# Patient Record
Sex: Female | Born: 2000 | Race: Black or African American | Hispanic: No | Smoking: Never smoker
Health system: Southern US, Community
[De-identification: ages and names within clinical notes are randomized; demographics above are authoritative.]

## PROBLEM LIST (undated history)

## (undated) DIAGNOSIS — J302 Other seasonal allergic rhinitis: Secondary | ICD-10-CM

## (undated) DIAGNOSIS — R51 Headache: Secondary | ICD-10-CM

## (undated) DIAGNOSIS — Z789 Other specified health status: Secondary | ICD-10-CM

## (undated) HISTORY — PX: NO PAST SURGERIES: SHX2092

## (undated) HISTORY — DX: Other specified health status: Z78.9

## (undated) HISTORY — DX: Headache: R51

---
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
## 2021-01-07 ENCOUNTER — Ambulatory Visit (INDEPENDENT_AMBULATORY_CARE_PROVIDER_SITE_OTHER): Payer: Medicaid Other

## 2021-01-07 ENCOUNTER — Other Ambulatory Visit: Payer: Self-pay

## 2021-01-07 VITALS — BP 112/68 | HR 72

## 2021-01-07 DIAGNOSIS — Z3042 Encounter for surveillance of injectable contraceptive: Secondary | ICD-10-CM

## 2021-01-07 MED ORDER — MEDROXYPROGESTERONE ACETATE 150 MG/ML IM SUSP
150.0000 mg | Freq: Once | INTRAMUSCULAR | Status: AC
  Administered 2021-01-07: 150 mg via INTRAMUSCULAR

## 2021-01-07 NOTE — Progress Notes (Signed)
Patient was assessed and managed by nursing staff during this encounter. I have reviewed the chart and agree with the documentation and plan.   Jaynie Collins, MD 01/07/2021 4:56 PM

## 2021-01-07 NOTE — Progress Notes (Signed)
Date last pap: NA Last Depo-Provera: 10/10/2020 Side Effects if any: None. Serum HCG indicated? NA. Depo-Provera 150 mg IM given by: Philemon Kingdom, CMA. Next appointment due: Aug 29th-Sept 12th

## 2021-01-30 ENCOUNTER — Ambulatory Visit: Payer: Medicaid Other | Admitting: Advanced Practice Midwife

## 2021-02-11 ENCOUNTER — Ambulatory Visit (INDEPENDENT_AMBULATORY_CARE_PROVIDER_SITE_OTHER): Payer: Medicaid Other | Admitting: Family

## 2021-02-11 ENCOUNTER — Other Ambulatory Visit: Payer: Self-pay

## 2021-02-11 VITALS — BP 116/74 | HR 68 | Wt 177.0 lb

## 2021-02-11 DIAGNOSIS — G43009 Migraine without aura, not intractable, without status migrainosus: Secondary | ICD-10-CM

## 2021-02-11 DIAGNOSIS — G44219 Episodic tension-type headache, not intractable: Secondary | ICD-10-CM | POA: Diagnosis not present

## 2021-02-11 DIAGNOSIS — G43701 Chronic migraine without aura, not intractable, with status migrainosus: Secondary | ICD-10-CM

## 2021-02-11 MED ORDER — NURTEC 75 MG PO TBDP
ORAL_TABLET | ORAL | 2 refills | Status: DC
Start: 2021-02-11 — End: 2021-02-28

## 2021-02-11 NOTE — Patient Instructions (Signed)
Thank you for coming in today.   Instructions for you until your next appointment are as follows: Continue the Emgality injections. Remember that you should not become pregnant while taking Emgality because of risk to the unborn baby. We will taper you off the Verapamil. To do this take it as follows: Take 1 tablet in the morning and 2 tablets at night for 1 week,  Then take 1 tablet in the morning and 1 tablet at night for 1 week Take none in the morning and 1 at night for 1 week,  Then stop the medication I have sent in a prescription for Nurtec ODT. This is to replace the Imitrex when you have a migraine. Take it as follows: Take 1 tablet at onset of migraine. May repeat it in 24 hours if needed. No more than 8 tablets per month.  Do not take Imitrex and Nurtec at the same time.  Remember that you should not become pregnant while taking Nurtec-ODT because of risk to the unborn baby. Check with [facility] - ask for [employer]. I will write a letter or complete a form if needed.  Please sign up for MyChart if you have not done so. Please plan to return for follow up in 3 months or sooner if needed.  At [facility], we are committed to providing exceptional care. You will receive a patient satisfaction survey through text or email regarding your visit today. Your opinion is important to me. Comments are appreciated.

## 2021-02-11 NOTE — Progress Notes (Signed)
Lindsay [name]   MRN:  [id number]  2001-04-27   Provider: Rockwell Germany NP-C Location of Care: [facility]  Visit type: Follow up  Last visit: 09/24/2020 Referral source: Roselind Messier, MD History from: Lindsay [name], Patient, mom  Brief history:  Copied from previous record: History of migraine and tension headaches, poorly controlled with preventative medication. She is taking Verapamil for migraine prevention and continues to experience about 15 migraine headaches per month. Emgality was started and tolerated well but she stopped it when she began pregnant. She has Sumatriptan and Promethazine for relief, as well as taking Ibuprofen. She has Odansetron that she uses at school. With her migraines she has holocephalic pain, intolerance to light and sound, dizziness and vomiting. She must sleep to obtain any relief.   Hollan delivered a healthy baby boy on September 10, 2020  Today's concerns: Lindsay [name] reports today that she continues to experience frequent migraine headaches. She does not feel that Verapamil has been very helpful to her. She has restarted Emgality now that her infant has been delivered and that she has stopped breast feeding. Lindsay [name] also does not feel that Sumatriptan has been particularly helpful and asks if there is another abortive medication to try.   Lindsay [name] will be attending [employer] this fall and has questions about what to do if a migraine occurs at school or if she has to miss class due to migraine.   Lindsay [name] has been otherwise generally healthy since she was last seen. Neither she nor her mother have other health concerns for her today other than previously mentioned.  Review of systems: Please see HPI for neurologic and other pertinent review of systems. Otherwise all other systems were reviewed and were negative.  Problem List: Patient Active Problem List   Diagnosis Date Noted   Chronic migraine without aura with status migrainosus,  not intractable 09/28/2020   Supervision of normal first pregnancy 09/09/2020   Supervision of normal first teen pregnancy 02/16/2020   Maternal exposure to teratogen, first trimester 02/16/2020   Keloid of skin 04/19/2019   Episodic tension type headache 11/01/2014   Intermittent asthma 11/21/2013   Attention deficit hyperactivity disorder (ADHD) 07/05/2013   Migraine without aura 05/09/2013     Past Medical History:  Diagnosis Date   Attention deficit disorder with hyperactivity(314.01) 07/05/2013   Episodic tension type headache 11/01/2014   Intermittent asthma 11/21/2013   Triggered by seasonal allergies & occasional exercise induced.    Keloid of skin 04/19/2019   Medical history non-contributory    Migraine without aura 05/09/2013   Seasonal allergies     Past medical history comments: See HPI Copied from previous record: Onset of migraines at 20 years of age. These were frequent and caused her to miss school. Headaches were prolonged lasting the better part of the day. She has been treated and failed Periactin (2 mg TID), and topiramate (60 mg BID). Amitriptyline also failed dose is unknown.  She has asthma which contraindicates propranolol.   Surgical history: Past Surgical History:  Procedure Laterality Date   NO PAST SURGERIES      Family history: family history includes Anxiety disorder in her mother; Constipation in her mother; Depression in her mother; GER disease in her mother; Irritable bowel syndrome in her mother; Migraines in her mother.   Social history: Social History   Socioeconomic History   Marital status: Single    Spouse name: Not on file   Number of children: Not on file   Years  of education: Not on file   Highest education level: Not on file  Occupational History   Not on file  Tobacco Use   Smoking status: Never   Smokeless tobacco: Never  Vaping Use   Vaping Use: Never used  Substance and Sexual Activity   Alcohol use: No    Alcohol/week: 0.0  standard drinks   Drug use: No   Sexual activity: Not Currently  Other Topics Concern   Not on file  Social History Narrative   Shakora graduated early from [employer].    She is waiting to be cleared to go back to work.    She lives with her mother.   She enjoys cooking, drawing, and painting.   Social Determinants of Health   Financial Resource Strain: Not on file  Food Insecurity: Not on file  Transportation Needs: Not on file  Physical Activity: Not on file  Stress: Not on file  Social Connections: Not on file  Intimate Partner Violence: Not on file    Past/failed meds: Copied from previous record: Cyproheptadine, Topiramate, Amitriptyline Divalproex stopped due to pregnancy 01/2020  Allergies: No Known Allergies   Immunizations: Immunization History  Administered Date(s) Administered   DTaP 05/06/2002, 08/17/2002, 10/06/2002, 05/23/2003, 03/09/2006   H1N1 07/05/2008   HPV 9-valent 06/12/2014   HPV Quadrivalent 11/21/2013, 02/01/2014   Hepatitis A 03/09/2006, 11/20/2006   Hepatitis B 07/24/02, 05/06/2002, 10/06/2002   HiB (PRP-OMP) 05/06/2002, 08/17/2002, 10/06/2002, 05/23/2003   IPV 05/06/2002, 08/17/2002, 10/06/2002, 03/09/2006   Influenza Nasal 07/02/2011, 04/12/2014   Influenza,inj,Quad PF,6+ Mos 09/13/2015, 04/25/2016, 04/29/2017   Influenza-Unspecified 05/29/2006, 07/09/2007, 07/05/2008, 05/22/2010, 09/23/2012, 05/03/2013   MMR 05/23/2003, 03/09/2006   Meningococcal Conjugate 11/21/2013   Pneumococcal-Unspecified 08/17/2002, 10/06/2002, 02/23/2004   Tdap 11/21/2013, 10/10/2020   Varicella 02/23/2004, 03/09/2006    Diagnostics/Screenings: Copied from previous record: CT Head 03/05/10 - normal   Physical Exam: BP 116/74   Pulse 68   Wt 177 lb (80.3 kg)   BMI 29.45 kg/m   General: Well developed, well nourished adolescent girl, seated on exam table, in no evident distress, black hair, brown eyes, right handed Head: Head normocephalic and atraumatic.   Oropharynx benign. Neck: Supple Cardiovascular: Regular rate and rhythm, no murmurs Respiratory: Breath sounds clear to auscultation Musculoskeletal: No obvious deformities or scoliosis Skin: No rashes or neurocutaneous lesions  Neurologic Exam Mental Status: Awake and fully alert.  Oriented to place and time.  Recent and remote memory intact.  Attention span, concentration, and fund of knowledge appropriate.  Mood and affect appropriate. Cranial Nerves: Fundoscopic exam reveals sharp disc margins.  Pupils equal, briskly reactive to light.  Extraocular movements full without nystagmus.  Visual fields full to confrontation.  Hearing intact and symmetric to finger rub.  Facial sensation intact.  Face tongue, palate move normally and symmetrically.  Neck flexion and extension normal. Motor: Normal bulk and tone. Normal strength in all tested extremity muscles. Sensory: Intact to touch and temperature in all extremities.  Coordination: Rapid alternating movements normal in all extremities.  Finger-to-nose and heel-to shin performed accurately bilaterally.  Romberg negative. Gait and Station: Arises from chair without difficulty.  Stance is normal. Gait demonstrates normal stride length and balance.   Able to heel, toe and tandem walk without difficulty. Reflexes: 1+ and symmetric. Toes downgoing.   Impression: Migraine without aura and without status migrainosus, not intractable - Plan: NURTEC 75 MG TBDP  Chronic migraine without aura with status migrainosus, not intractable  Episodic tension-type headache, not intractable   Recommendations  for plan of care: The patient's previous [facility] records were reviewed. Kylena  has neither had nor required imaging or lab studies since the last visit, other than what was been performed by her OB/GYN. She is an 20 year old girl with history of frequent severe migraine and tension headaches. She has not experienced good success with any preventative  medication. Kristine Linea wants to taper off Verapamil and gave her written instructions on doing so. She has restarted Emgality injections and explained to her that is acceptable as long as she is using reliable birth control, as the effect of this medication is not known in pregnancy.   Cloee does not feel that Sumatriptan is helpful in giving her relief. I recommended a trial of Nurtec-ODT and gave her written instructions on how to take it when a migraine occurs.   Meliza will be attending [employer] this fall and I recommended that she talk with the office of disability services for that school regarding a plan for migraine days. I will be happy to complete a form or write a letter for her if needed.   I will otherwise see Chamari back in follow up in 3 months or sooner if needed. She agreed with the plans made today.   The medication list was reviewed and reconciled. I reviewed changes that were made in the prescribed medications today. A complete medication list was provided to the patient.  Return in about 3 months (around 05/14/2021).   Allergies as of 02/11/2021   No Known Allergies      Medication List        Accurate as of February 11, 2021 11:59 PM. If you have any questions, ask your nurse or doctor.          STOP taking these medications    SUMAtriptan 100 MG tablet Commonly known as: IMITREX Stopped by: Rockwell Germany, NP       TAKE these medications    acetaminophen 500 MG tablet Commonly known as: TYLENOL Take 1,000 mg by mouth every 6 (six) hours as needed for mild pain or headache.   Emgality 120 MG/ML Soaj Generic drug: Galcanezumab-gnlm Inject 120 mg into the skin every 30 (thirty) days.   medroxyPROGESTERone 150 MG/ML injection Commonly known as: DEPO-PROVERA Inject 1 mL (150 mg total) into the muscle every 3 (three) months.   norgestimate-ethinyl estradiol 0.25-35 MG-MCG tablet Commonly known as: ORTHO-CYCLEN Take 1 tablet by mouth daily.   Nurtec  75 MG Tbdp Generic drug: Rimegepant Sulfate Take 1 tablet at onset of migraine. May take another tablet in 48 hours if needed. No more than 8 tablets in 30 days. Started by: Rockwell Germany, NP   PrePLUS 27-1 MG Tabs Take 1 tablet by mouth daily.   verapamil 40 MG tablet Commonly known as: CALAN TAKE 2 TABLETS BY MOUTH IN THE MORNING AND IN THE EVENING        Total time spent with the patient was 30 minutes, of which 50% or more was spent in counseling and coordination of care.  Rockwell Germany NP-C [facility] Ph. [phone] Fax [phone]

## 2021-02-12 ENCOUNTER — Other Ambulatory Visit (INDEPENDENT_AMBULATORY_CARE_PROVIDER_SITE_OTHER): Payer: Self-pay | Admitting: Family

## 2021-02-12 MED ORDER — EMGALITY 120 MG/ML ~~LOC~~ SOAJ: 120.0000 mg | SUBCUTANEOUS | 5 refills | Status: DC

## 2021-02-12 NOTE — Telephone Encounter (Signed)
Please send to the pharmacy °

## 2021-02-12 NOTE — Telephone Encounter (Signed)
Who's calling (name and relationship to patient) : Elease Hashimoto Darcey mom   Best contact number: [phone]  Provider they see: Elveria Rising  Reason for call:   Call ID:      PRESCRIPTION REFILL ONLY  Name of prescription: Emgality  Pharmacy: [facility] [place] [address]

## 2021-02-12 NOTE — Telephone Encounter (Signed)
Done. TG 

## 2021-02-17 ENCOUNTER — Encounter (INDEPENDENT_AMBULATORY_CARE_PROVIDER_SITE_OTHER): Payer: Self-pay | Admitting: Family

## 2021-02-20 ENCOUNTER — Telehealth (INDEPENDENT_AMBULATORY_CARE_PROVIDER_SITE_OTHER): Payer: Self-pay | Admitting: Family

## 2021-02-20 NOTE — Telephone Encounter (Signed)
  Who's calling (name and relationship to patient) :[place] (Mother)  Best contact number: [phone] (Home) Provider they see: Elveria Rising, NP Reason for call: Please send PA to pharmacy.The pharmacy is stating that is what is need in order to fill rx.     PRESCRIPTION REFILL ONLY  Name of prescription:  Pharmacy:

## 2021-02-21 NOTE — Telephone Encounter (Signed)
Lvm for mom letting her know the PA was approved and sent to the pharmacy

## 2021-02-26 ENCOUNTER — Telehealth (INDEPENDENT_AMBULATORY_CARE_PROVIDER_SITE_OTHER): Payer: Self-pay | Admitting: Family

## 2021-02-26 DIAGNOSIS — G43009 Migraine without aura, not intractable, without status migrainosus: Secondary | ICD-10-CM

## 2021-02-26 NOTE — Telephone Encounter (Signed)
I called and spoke to Mom. I explained that the PA had been denied and that I had sent in an appeal. I received a fax today requesting a written consent from [name] in order to process the appeal. Mom agreed to bring [name] to the office tomorrow to sign a consent. TG

## 2021-02-26 NOTE — Telephone Encounter (Signed)
  Who's calling (name and relationship to patient) :[place] (Mother)  Best contact number: [phone] (Home) Provider they see: Elveria Rising, NP Reason for call: Mom is stating that even after we have sent over PA for prescription that the pharmacy is still stating they do not have it. Please contact pharmacy to insure they have received PA and contact mom when completed. Mom is overwhelmed with the process and how long this is taking she is aware that it is not on behalf of the office however she is still wanting to express her disappointment in the communication between the pharmacy and ourselves.     PRESCRIPTION REFILL ONLY  Name of prescription:  Pharmacy:

## 2021-02-28 MED ORDER — UBRELVY 50 MG PO TABS
ORAL_TABLET | ORAL | 5 refills | Status: DC
Start: 2021-02-28 — End: 2021-08-01

## 2021-02-28 NOTE — Addendum Note (Signed)
Addended by: Princella Ion on: 02/28/2021 08:20 AM   Modules accepted: Orders

## 2021-02-28 NOTE — Telephone Encounter (Signed)
I learned from [name] that Lindsay [name] is preferred. I sent in a new prescription for Ubrelvy. I attempted to call her and let her know but did not receive an answer. I will try again later today. TG

## 2021-02-28 NOTE — Telephone Encounter (Signed)
I called Mom and told her that I had learned that [place] insurance will cover Bernita Raisin and that I sent in a prescription for that. I asked Mom to let me know how that worked for the migraines. TG

## 2021-03-21 ENCOUNTER — Telehealth: Payer: Self-pay | Admitting: Radiology

## 2021-03-21 NOTE — Telephone Encounter (Signed)
Left message to call [facility] to remove depo injection to either 03/25/21 to a morning appointment or to move to 03/27/21 in the morning.

## 2021-03-25 ENCOUNTER — Ambulatory Visit: Payer: Medicaid Other

## 2021-03-25 ENCOUNTER — Telehealth (INDEPENDENT_AMBULATORY_CARE_PROVIDER_SITE_OTHER): Payer: Self-pay | Admitting: Family

## 2021-03-25 NOTE — Telephone Encounter (Signed)
Mother called back stating Inetta Fermo was going to write a care plan for patient to have at school regarding her headaches. She can be reached at [phone]. There is a DPR on file to speak with mother. I asked mother if a document was provided to [place] to fil out. She stated no document was needed, that Inetta Fermo was going to write something.  Barrington Ellison

## 2021-03-25 NOTE — Telephone Encounter (Signed)
  Who's calling (name and relationship to patient) : Reeda, [place] (Mother) Best contact number: [phone] (Home) Provider they see: Elveria Rising, NP Reason for call: Please have tina call mom to discuss documents that are needed to for [place] headaches that should be given to the college.     PRESCRIPTION REFILL ONLY  Name of prescription:  Pharmacy:

## 2021-03-25 NOTE — Telephone Encounter (Signed)
I called and spoke with Mom. She said that Lindsay [name] has an appointment with student services tomorrow to make a plan for her migraine headaches. Mom said that she needs a letter to present to the school for the meeting. I told Mom that I will write a letter and that she can pick it up tomorrow. TG

## 2021-03-28 ENCOUNTER — Other Ambulatory Visit: Payer: Self-pay

## 2021-03-28 ENCOUNTER — Ambulatory Visit (INDEPENDENT_AMBULATORY_CARE_PROVIDER_SITE_OTHER): Payer: Medicaid Other | Admitting: *Deleted

## 2021-03-28 DIAGNOSIS — Z3042 Encounter for surveillance of injectable contraceptive: Secondary | ICD-10-CM | POA: Diagnosis not present

## 2021-03-28 MED ORDER — MEDROXYPROGESTERONE ACETATE 150 MG/ML IM SUSP
150.0000 mg | Freq: Once | INTRAMUSCULAR | Status: AC
  Administered 2021-03-28: 150 mg via INTRAMUSCULAR

## 2021-03-28 NOTE — Progress Notes (Addendum)
Subjective:  Pt in for Depo Provera injection.    Objective: Need for contraception. No unusual complaints.    Assessment: Depo given R upper outer quadrant. Pt tolerated Depo injection.   Plan:  Next injection due Nov 17-Dec 1.    Administrations This Visit     medroxyPROGESTERone (DEPO-PROVERA) injection 150 mg     Admin Date 03/28/2021 Action Given Dose 150 mg Route Intramuscular Administered By Lewayne Bunting, CMA

## 2021-04-02 NOTE — Progress Notes (Signed)
Patient was assessed and managed by nursing staff during this encounter. I have reviewed the chart and agree with the documentation and plan. I have also made any necessary editorial changes.  Tasha Diaz, MD 04/02/2021 9:51 AM   

## 2021-04-17 ENCOUNTER — Telehealth (INDEPENDENT_AMBULATORY_CARE_PROVIDER_SITE_OTHER): Payer: Self-pay | Admitting: Family

## 2021-04-17 NOTE — Telephone Encounter (Signed)
  Who's calling (name and relationship to patient) : Elease Hashimoto - mom  Best contact number: [phone]  Provider they see: Elveria Rising  Reason for call: Mom requests call back from [place] regarding patient's headaches.    PRESCRIPTION REFILL ONLY  Name of prescription:  Pharmacy:

## 2021-04-19 NOTE — Telephone Encounter (Signed)
Spoke with mom to get further details and she informs she needed to speak with Inetta Fermo, let mom know that Inetta Fermo is out of the office and I am her medical assistant so I could take a message to pass it along to her. Mom declined to leave any details and asked that Inetta Fermo contact her upon her return to the office.

## 2021-04-22 NOTE — Telephone Encounter (Signed)
I called and spoke with Mom. She is frustrated that [name] continues to have so many migraines. She said that she is averaging 15-16 per month. I told Mom that options are limited but that I will refer Idamae to be considered for Botox if she is interested. Mom will talk with [name]. TG

## 2021-05-20 ENCOUNTER — Ambulatory Visit (INDEPENDENT_AMBULATORY_CARE_PROVIDER_SITE_OTHER): Payer: Medicaid Other | Admitting: Family

## 2021-05-27 ENCOUNTER — Telehealth (INDEPENDENT_AMBULATORY_CARE_PROVIDER_SITE_OTHER): Payer: Self-pay | Admitting: Family

## 2021-05-27 DIAGNOSIS — G43009 Migraine without aura, not intractable, without status migrainosus: Secondary | ICD-10-CM

## 2021-05-27 MED ORDER — EMGALITY 120 MG/ML ~~LOC~~ SOAJ: 120.0000 mg | SUBCUTANEOUS | 5 refills | Status: DC

## 2021-05-27 NOTE — Telephone Encounter (Signed)
  Who's calling (name and relationship to patient) :mom / Jerilynn Mages contact number:[phone]  Provider they QQP:YPPJ Goodpasture   Reason for call:medication Refill, PA for [facility]      PRESCRIPTION REFILL ONLY  Name of prescription:EMGALITY   [facility] [place], [place]

## 2021-05-27 NOTE — Telephone Encounter (Signed)
Mom has called in again for a different medication, stated she has called 2 weeks ago in regards of medication. She was told that it needs to be a prior authorization. Medication is not listed.  Mom has requested a call back.

## 2021-05-27 NOTE — Telephone Encounter (Signed)
I called and told Mom that insurance will not cover Nurtec, which is what she was wanting for [name]. They will pay for Bernita Raisin and there is a prescription on file for that. I told Mom that I will be happy to refer Rudie for Botox treatments if she chooses to do so. Mom will talk with Joleene and call me back. TG

## 2021-05-27 NOTE — Telephone Encounter (Signed)
Unless her insurance has changed, insurance approved Emgality thru June 2023. I sent in a refill for the medication. Please let Mom know. Thanks, Inetta Fermo

## 2021-05-28 ENCOUNTER — Telehealth (INDEPENDENT_AMBULATORY_CARE_PROVIDER_SITE_OTHER): Payer: Self-pay | Admitting: Family

## 2021-05-28 DIAGNOSIS — Z1159 Encounter for screening for other viral diseases: Secondary | ICD-10-CM | POA: Diagnosis not present

## 2021-05-28 DIAGNOSIS — J209 Acute bronchitis, unspecified: Secondary | ICD-10-CM | POA: Diagnosis not present

## 2021-05-28 NOTE — Telephone Encounter (Signed)
  Who's calling (name and relationship to patient) :Sam with [employer]   Best contact number:[phone]  Ref # [id number]  Provider they TJQ:ZESP Goodpasture   Reason for call:called to discuss and appeal and has requested a call back      PRESCRIPTION REFILL ONLY  Name of prescription:  Pharmacy:

## 2021-05-28 NOTE — Telephone Encounter (Signed)
I called and spoke with [employer]. They called to let me know that an appeal could be initiated through [employer]. I have done an appeal a few months ago for Nurtec and her insurance denied the medication upon appeal.The case was closed. TG

## 2021-06-03 ENCOUNTER — Telehealth (INDEPENDENT_AMBULATORY_CARE_PROVIDER_SITE_OTHER): Payer: Self-pay | Admitting: Family

## 2021-06-03 NOTE — Telephone Encounter (Signed)
  Who's calling (name and relationship to patient) : Mom  Lindsay [name]   Best contact number: [phone]  Provider they TWS:FKCL Goodpasture  Reason for call: Mom need document for College concerning her medical condition. Patient is having difficulty with one of the professor concerning her being out and giving her time to get her work completed      PRESCRIPTION REFILL ONLY  Name of prescription:  Pharmacy:

## 2021-06-05 ENCOUNTER — Telehealth (INDEPENDENT_AMBULATORY_CARE_PROVIDER_SITE_OTHER): Payer: Self-pay | Admitting: Family

## 2021-06-05 NOTE — Telephone Encounter (Signed)
Contacted patient and scheduled an appointment for 07/04/2021. Let patient know Inetta Fermo is out of the office and may not be able to get that for her until her return. Patient asked if there was another provider who could provide the letter for her. Let mom know as Inetta Fermo is the provider we would need to get the okay for the letter from [facility] before another provider could sign the letter for the family. Patient states understanding and ended the call.

## 2021-06-05 NOTE — Telephone Encounter (Signed)
The letter has been written for [name]. Please print and let her know that it is ready. Thank you for scheduling a follow up appointment for her.  Lindsay [name]

## 2021-06-05 NOTE — Telephone Encounter (Signed)
Lindsay [name] has contacted the office requesting a letter as her mom stated. A DPR was emailed to patient for completion to include her mom

## 2021-06-10 NOTE — Telephone Encounter (Signed)
Contacted patient and let her know the letter is available for pick up. Patient states understanding and ended the call.

## 2021-06-13 ENCOUNTER — Ambulatory Visit: Payer: Medicaid Other

## 2021-06-13 NOTE — Telephone Encounter (Signed)
error 

## 2021-06-18 ENCOUNTER — Ambulatory Visit: Payer: Medicaid Other

## 2021-06-18 ENCOUNTER — Other Ambulatory Visit: Payer: Self-pay

## 2021-06-19 ENCOUNTER — Telehealth (INDEPENDENT_AMBULATORY_CARE_PROVIDER_SITE_OTHER): Payer: Self-pay | Admitting: Family

## 2021-06-19 ENCOUNTER — Ambulatory Visit (INDEPENDENT_AMBULATORY_CARE_PROVIDER_SITE_OTHER): Payer: Medicaid Other | Admitting: *Deleted

## 2021-06-19 VITALS — BP 113/71 | HR 69

## 2021-06-19 DIAGNOSIS — Z3042 Encounter for surveillance of injectable contraceptive: Secondary | ICD-10-CM

## 2021-06-19 DIAGNOSIS — G43009 Migraine without aura, not intractable, without status migrainosus: Secondary | ICD-10-CM

## 2021-06-19 MED ORDER — VERAPAMIL HCL 40 MG PO TABS: ORAL_TABLET | ORAL | 5 refills | Status: DC

## 2021-06-19 MED ORDER — MEDROXYPROGESTERONE ACETATE 150 MG/ML IM SUSP
150.0000 mg | Freq: Once | INTRAMUSCULAR | Status: AC
  Administered 2021-06-19: 150 mg via INTRAMUSCULAR

## 2021-06-19 NOTE — Progress Notes (Signed)
Date last pap: NA. Last Depo-Provera: 03/28/2021. Side Effects if any: None. Serum HCG indicated? NA. Depo-Provera 150 mg IM given by: Scheryl Marten, RN . Next appointment due Feb 8-22nd.Marland Kitchen

## 2021-06-19 NOTE — Telephone Encounter (Signed)
Mom has called back in again, staing she has been waiting for a return call. Mom stated Patient needs Verapamil refilled.   Mom has requested a call back.

## 2021-06-19 NOTE — Telephone Encounter (Signed)
Spoke with mom let her know that the medication that was advised to stop is sumatriptan. She also states that she needs a refill for verapamil.

## 2021-06-19 NOTE — Telephone Encounter (Signed)
  Who's calling (name and relationship to patient) : Mother  Best contact number: [phone]  Provider they VWP:VXYI   Reason for call: Mom is getting meds refilled and she cant remember what meds that Inetta Fermo told her to stop taking sum? Ant the Verapamil.Please call to confirm what medications she doesn't have to take anymore   PRESCRIPTION REFILL ONLY  Name of prescription:  Pharmacy:

## 2021-06-19 NOTE — Telephone Encounter (Signed)
Refill has been sent to pharmacy.  

## 2021-06-19 NOTE — Progress Notes (Signed)
Patient seen and assessed by nursing staff.  Agree with documentation and plan.  

## 2021-07-04 ENCOUNTER — Other Ambulatory Visit: Payer: Self-pay

## 2021-07-04 ENCOUNTER — Ambulatory Visit (INDEPENDENT_AMBULATORY_CARE_PROVIDER_SITE_OTHER): Payer: Medicaid Other | Admitting: Family

## 2021-07-04 VITALS — BP 114/68 | HR 84 | Wt 199.4 lb

## 2021-07-04 DIAGNOSIS — F411 Generalized anxiety disorder: Secondary | ICD-10-CM

## 2021-07-04 DIAGNOSIS — G43701 Chronic migraine without aura, not intractable, with status migrainosus: Secondary | ICD-10-CM

## 2021-07-04 DIAGNOSIS — G44219 Episodic tension-type headache, not intractable: Secondary | ICD-10-CM

## 2021-07-04 MED ORDER — SERTRALINE HCL 25 MG PO TABS: 25.0000 mg | ORAL_TABLET | Freq: Every day | ORAL | 0 refills | Status: DC

## 2021-07-04 NOTE — Progress Notes (Signed)
Lindsay [name]   MRN:  [id number]  10/05/2000   Provider: Rockwell Germany NP-C Location of Care: [facility]  Visit type: Follow up  Last visit: 02/11/2021 Referral source: Roselind Messier, MD History from: Patient, [facility] Chart  Brief history:  Copied from previous record: History of migraine and tension headaches, poorly controlled with preventative medication. She is taking Verapamil for migraine prevention and continues to experience about 15 migraine headaches per month. Emgality was started and tolerated well but she stopped it when she began pregnant. She [name] Sumatriptan and Promethazine for relief, as well as taking Ibuprofen. She [name] Odansetron that she uses at school. With her migraines she [name] holocephalic pain, intolerance to light and sound, dizziness and vomiting. She must sleep to obtain any relief.   Lindsay [name] delivered a healthy baby boy on September 10, 2020   Today's concerns: Lindsay [name] and her mother report today that she continues to have migraines and that it [name] affected her ability to attend college and get course work done. She believes that she [name] 15-16 migraines per month. She also reports problems with memory and being able to keep track of all her work and responsibilities.   Lindsay [name] and her mother report that she is having problems with anxiety, and believe that is a trigger for migraines. Mom notes that she also [name] anxiety and is on medication.   Lindsay [name] been otherwise generally healthy since she was last seen. Neither she nor her mother have other health concerns for her today other than previously mentioned.  Review of systems: Please see HPI for neurologic and other pertinent review of systems. Otherwise all other systems were reviewed and were negative.  Problem List: Patient Active Problem List   Diagnosis Date Noted   Chronic migraine without aura with status migrainosus, not intractable 09/28/2020   Supervision of normal  first pregnancy 09/09/2020   Supervision of normal first teen pregnancy 02/16/2020   Maternal exposure to teratogen, first trimester 02/16/2020   Keloid of skin 04/19/2019   Episodic tension type headache 11/01/2014   Intermittent asthma 11/21/2013   Attention deficit hyperactivity disorder (ADHD) 07/05/2013   Migraine without aura 05/09/2013     Past Medical History:  Diagnosis Date   Attention deficit disorder with hyperactivity(314.01) 07/05/2013   Episodic tension type headache 11/01/2014   Intermittent asthma 11/21/2013   Triggered by seasonal allergies & occasional exercise induced.    Keloid of skin 04/19/2019   Medical history non-contributory    Migraine without aura 05/09/2013   Seasonal allergies     Past medical history comments: See HPI Copied from previous record: Onset of migraines at 20 years of age. These were frequent and caused her to miss school. Headaches were prolonged lasting the better part of the day. She [name] been treated and failed Periactin (2 mg TID), and topiramate (60 mg BID). Amitriptyline also failed dose is unknown.  She [name] asthma which contraindicates propranolol.  Surgical history: Past Surgical History:  Procedure Laterality Date   NO PAST SURGERIES       Family history: family history includes Anxiety disorder in her mother; Constipation in her mother; Depression in her mother; GER disease in her mother; Irritable bowel syndrome in her mother; Migraines in her mother.   Social history: Social History   Socioeconomic History   Marital status: Single    Spouse name: Not on file   Number of children: Not on file   Years of education: Not on file  Highest education level: Not on file  Occupational History   Not on file  Tobacco Use   Smoking status: Never   Smokeless tobacco: Never  Vaping Use   Vaping Use: Never used  Substance and Sexual Activity   Alcohol use: No    Alcohol/week: 0.0 standard drinks   Drug use: No   Sexual  activity: Not Currently  Other Topics Concern   Not on file  Social History Narrative   Lindsay [name] graduated early from [employer].    She is waiting to be cleared to go back to work.    She lives with her mother.   She enjoys cooking, drawing, and painting.   Social Determinants of Health   Financial Resource Strain: Not on file  Food Insecurity: Not on file  Transportation Needs: Not on file  Physical Activity: Not on file  Stress: Not on file  Social Connections: Not on file  Intimate Partner Violence: Not on file    Past/failed meds: Copied from previous record: Cyproheptadine, Topiramate, Amitriptyline Divalproex stopped due to pregnancy 01/2020  Allergies: No Known Allergies   Immunizations: Immunization History  Administered Date(s) Administered   DTaP 05/06/2002, 08/17/2002, 10/06/2002, 05/23/2003, 03/09/2006   H1N1 07/05/2008   HPV 9-valent 06/12/2014   HPV Quadrivalent 11/21/2013, 02/01/2014   Hepatitis A 03/09/2006, 11/20/2006   Hepatitis B 11/14/01, 05/06/2002, 10/06/2002   HiB (PRP-OMP) 05/06/2002, 08/17/2002, 10/06/2002, 05/23/2003   IPV 05/06/2002, 08/17/2002, 10/06/2002, 03/09/2006   Influenza Nasal 07/02/2011, 04/12/2014   Influenza,inj,Quad PF,6+ Mos 09/13/2015, 04/25/2016, 04/29/2017   Influenza-Unspecified 05/29/2006, 07/09/2007, 07/05/2008, 05/22/2010, 09/23/2012, 05/03/2013   MMR 05/23/2003, 03/09/2006   Meningococcal Conjugate 11/21/2013   Pneumococcal-Unspecified 08/17/2002, 10/06/2002, 02/23/2004   Tdap 11/21/2013, 10/10/2020   Varicella 02/23/2004, 03/09/2006    Diagnostics/Screenings: Copied from previous record: CT Head 03/05/10 - normal  Physical Exam: BP 114/68   Pulse 84   Wt 199 lb 6.4 oz (90.4 kg)   BMI 33.18 kg/m   General: Well developed, well nourished adolescent girl, seated on exam table, in no evident distress Head: Head normocephalic and atraumatic.  Oropharynx benign. Neck: Supple Cardiovascular: Regular rate and rhythm,  no murmurs Respiratory: Breath sounds clear to auscultation Musculoskeletal: No obvious deformities or scoliosis Skin: No rashes or neurocutaneous lesions  Neurologic Exam Mental Status: Awake and fully alert.  Oriented to place and time.  Recent and remote memory intact.  Attention span, concentration, and fund of knowledge appropriate.  Mood and affect appropriate. Cranial Nerves: Fundoscopic exam reveals sharp disc margins.  Pupils equal, briskly reactive to light.  Extraocular movements full without nystagmus. Hearing intact and symmetric to whisper.  Facial sensation intact.  Face tongue, palate move normally and symmetrically. Shoulder shrug normal Motor: Normal bulk and tone. Normal strength in all tested extremity muscles. Sensory: Intact to touch and temperature in all extremities.  Coordination: Rapid alternating movements normal in all extremities.  Finger-to-nose and heel-to shin performed accurately bilaterally.  Romberg negative. Gait and Station: Arises from chair without difficulty.  Stance is normal. Gait demonstrates normal stride length and balance.   Able to heel, toe and tandem walk without difficulty. Reflexes: 1+ and symmetric. Toes downgoing.   Impression: Chronic migraine without aura with status migrainosus, not intractable  Generalized anxiety disorder - Plan: sertraline (ZOLOFT) 25 MG tablet  Episodic tension-type headache, not intractable   Recommendations for plan of care: The patient's previous [facility] records were reviewed. Lindsay [name] [name] neither had nor required imaging or lab studies since the last visit. She is a  20 year old girl with history of frequent migraine and tension headaches. She admits to considerable anxiety today and is interested in treatment to see if it would with headaches. I talked with Lindsay [name] and her mother at some length today and told her that I would agree to prescribing Sertraline for her but that she needs to follow up closely with me as  instructed. Lindsay [name] [name] missed several appointments since her last visit. I also informed her that Sertraline should not be taken by pregnant women and that she needs to take adequate precautions against becoming pregnant. We also talked about getting established with a therapist to help her learn how to manage anxiety better. Finally we talked about other options for headache treatment and I explained that options are limited at this time I am happy to transfer her care to an adult neurology practice so that she could investigate other options but she is not willing to do that at this time. I will see Lindsay [name] back in follow up in 1 month or sooner if needed. She and her mother agreed with the plans made today.   The medication list was reviewed and reconciled. I reviewed changes that were made in the prescribed medications today. A complete medication list was provided to the patient.  Return in about 1 month (around 08/04/2021).   Allergies as of 07/04/2021   No Known Allergies      Medication List        Accurate as of July 04, 2021 11:59 PM. If you have any questions, ask your nurse or doctor.          acetaminophen 500 MG tablet Commonly known as: TYLENOL Take 1,000 mg by mouth every 6 (six) hours as needed for mild pain or headache.   Emgality 120 MG/ML Soaj Generic drug: Galcanezumab-gnlm Inject 120 mg into the skin every 30 (thirty) days.   medroxyPROGESTERone 150 MG/ML injection Commonly known as: DEPO-PROVERA Inject 1 mL (150 mg total) into the muscle every 3 (three) months.   norgestimate-ethinyl estradiol 0.25-35 MG-MCG tablet Commonly known as: ORTHO-CYCLEN Take 1 tablet by mouth daily.   PrePLUS 27-1 MG Tabs Take 1 tablet by mouth daily.   sertraline 25 MG tablet Commonly known as: ZOLOFT Take 1 tablet (25 mg total) by mouth daily. Started by: Rockwell Germany, NP   Ubrelvy 50 MG Tabs Generic drug: Ubrogepant Take 1 tablet at onset of migraine. May  repeat in 2 hours if migraine still present. Do not take more than 2 tablets in 24 hour period.   verapamil 40 MG tablet Commonly known as: CALAN TAKE 2 TABLETS BY MOUTH IN THE MORNING AND IN THE EVENING      Total time spent with the patient was 30 minutes, of which 50% or more was spent in counseling and coordination of care.  Rockwell Germany NP-C [facility] Ph. [phone] Fax [phone]

## 2021-07-12 ENCOUNTER — Encounter (INDEPENDENT_AMBULATORY_CARE_PROVIDER_SITE_OTHER): Payer: Self-pay | Admitting: Family

## 2021-07-12 NOTE — Patient Instructions (Signed)
Thank you for coming in today.   Instructions for you until your next appointment are as follows: We will start Sertraline 25mg  - 1 tablet per day. It is important to take it at the same time each day.  Call me in 2 weeks to let me know how you are doing. We may need to increase the dose at that time.  Continue your other medications as prescribed Consider the options of transferring your care to an adult neurology provider as we discussed today to see if they can offer more options for your headaches Please sign up for MyChart if you have not done so. Please plan to return for follow up in 1 month or sooner if needed.  At [facility], we are committed to providing exceptional care. You will receive a patient satisfaction survey through text or email regarding your visit today. Your opinion is important to me. Comments are appreciated.

## 2021-07-25 ENCOUNTER — Telehealth (INDEPENDENT_AMBULATORY_CARE_PROVIDER_SITE_OTHER): Payer: Self-pay | Admitting: Family

## 2021-07-25 DIAGNOSIS — F411 Generalized anxiety disorder: Secondary | ICD-10-CM

## 2021-07-25 MED ORDER — SERTRALINE HCL 50 MG PO TABS: ORAL_TABLET | ORAL | 1 refills | Status: DC

## 2021-07-25 NOTE — Telephone Encounter (Signed)
°  Who's calling (name and relationship to patient) : Patrica Mother   Best contact [phone]  Provider they see: Rudean [place]   Reason for call: Patient is having issues with new Medication she is taking      PRESCRIPTION REFILL ONLY  Name of prescription:  Pharmacy:

## 2021-07-25 NOTE — Telephone Encounter (Signed)
I called and spoke with Mom. She said that the Sertraline was helping Lindsay [name]'s anxiety and that she had not experienced side effects. Keiran felt that she needed an increase in dose. I recommended increasing the dose to 50mg  per day and asked her to let me know how she is doing with that. TG

## 2021-07-25 NOTE — Telephone Encounter (Signed)
I called and left a message for Mom. I will call her again later today. TG 

## 2021-07-25 NOTE — Addendum Note (Signed)
Addended by: Princella Ion on: 07/25/2021 06:00 PM   Modules accepted: Orders

## 2021-08-01 ENCOUNTER — Other Ambulatory Visit (INDEPENDENT_AMBULATORY_CARE_PROVIDER_SITE_OTHER): Payer: Self-pay | Admitting: Family

## 2021-08-01 DIAGNOSIS — G43009 Migraine without aura, not intractable, without status migrainosus: Secondary | ICD-10-CM

## 2021-08-01 MED ORDER — UBRELVY 50 MG PO TABS: ORAL_TABLET | ORAL | 5 refills | Status: DC

## 2021-08-09 ENCOUNTER — Other Ambulatory Visit: Payer: Self-pay

## 2021-08-09 ENCOUNTER — Encounter (HOSPITAL_COMMUNITY): Payer: Self-pay | Admitting: *Deleted

## 2021-08-09 ENCOUNTER — Ambulatory Visit (HOSPITAL_COMMUNITY)
Admission: EM | Admit: 2021-08-09 | Discharge: 2021-08-09 | Disposition: A | Discharge status: Home / Self Care | Payer: Medicaid Other | Attending: Student | Admitting: Student

## 2021-08-09 DIAGNOSIS — J069 Acute upper respiratory infection, unspecified: Secondary | ICD-10-CM

## 2021-08-09 MED ORDER — PROMETHAZINE-DM 6.25-15 MG/5ML PO SYRP: 5.0000 mL | ORAL_SOLUTION | Freq: Four times a day (QID) | ORAL | 0 refills | Status: DC | PRN

## 2021-08-09 MED ORDER — BENZONATATE 100 MG PO CAPS: 100.0000 mg | ORAL_CAPSULE | Freq: Three times a day (TID) | ORAL | 0 refills | Status: DC

## 2021-08-09 MED ORDER — IBUPROFEN 600 MG PO TABS: 600.0000 mg | ORAL_TABLET | Freq: Three times a day (TID) | ORAL | 0 refills | Status: DC | PRN

## 2021-08-09 NOTE — ED Triage Notes (Signed)
Pt reports  HA, congestion and has not been to school for 2 days.

## 2021-08-09 NOTE — Discharge Instructions (Addendum)
-  Promethazine DM cough syrup for congestion/cough. This could make you drowsy, so take at night before bed. -Tessalon (Benzonatate) as needed for cough. Take one pill up to 3x daily (every 8 hours) -You can take Tylenol up to 1000 mg 3 times daily, and ibuprofen up to 600 mg 3 times daily with food.  You can take these together, or alternate every 3-4 hours. -With a virus, you're typically contagious for 5-7 days, or as long as you're having fevers.

## 2021-08-09 NOTE — ED Provider Notes (Signed)
[place]    CSN: [id number] Arrival date & time: 08/09/21  1855      History   Chief Complaint Chief Complaint  Patient presents with   Nasal Congestion   Headache    HPI Lindsay [name] is a 21 y.o. female presenting with viral syndrome for 2 days.  Medical history allergies.  Describes nasal congestion, headaches, decreased appetite.  Has attempted Mucinex once with minimal relief.  Cough is nonproductive.  Denies shortness of breath, chest pain, fever/chills.  Has not monitored temperature at home.  HPI  Past Medical History:  Diagnosis Date   Attention deficit disorder with hyperactivity(314.01) 07/05/2013   Episodic tension type headache 11/01/2014   Intermittent asthma 11/21/2013   Triggered by seasonal allergies & occasional exercise induced.    Keloid of skin 04/19/2019   Medical history non-contributory    Migraine without aura 05/09/2013   Seasonal allergies     Patient Active Problem List   Diagnosis Date Noted   Chronic migraine without aura with status migrainosus, not intractable 09/28/2020   Supervision of normal first pregnancy 09/09/2020   Supervision of normal first teen pregnancy 02/16/2020   Maternal exposure to teratogen, first trimester 02/16/2020   Keloid of skin 04/19/2019   Episodic tension type headache 11/01/2014   Intermittent asthma 11/21/2013   Attention deficit hyperactivity disorder (ADHD) 07/05/2013   Migraine without aura 05/09/2013    Past Surgical History:  Procedure Laterality Date   NO PAST SURGERIES      OB History     Gravida  1   Para  1   Term  1   Preterm      AB      Living  1      SAB      IAB      Ectopic      Multiple  0   Live Births  1            Home Medications    Prior to Admission medications   Medication Sig Start Date End Date Taking? Authorizing Provider  benzonatate (TESSALON) 100 MG capsule Take 1 capsule (100 mg total) by mouth every 8 (eight) hours. 08/09/21  Yes  Hazel Sams, PA-C  ibuprofen (ADVIL) 600 MG tablet Take 1 tablet (600 mg total) by mouth every 8 (eight) hours as needed. 08/09/21  Yes Hazel Sams, PA-C  promethazine-dextromethorphan (PROMETHAZINE-DM) 6.25-15 MG/5ML syrup Take 5 mLs by mouth 4 (four) times daily as needed for cough. 08/09/21  Yes Hazel Sams, PA-C  acetaminophen (TYLENOL) 500 MG tablet Take 1,000 mg by mouth every 6 (six) hours as needed for mild pain or headache.    [provider]  EMGALITY 120 MG/ML SOAJ Inject 120 mg into the skin every 30 (thirty) days. 05/27/21   Rockwell Germany, NP  medroxyPROGESTERone (DEPO-PROVERA) 150 MG/ML injection Inject 1 mL (150 mg total) into the muscle every 3 (three) months. 10/10/20   Darlina Rumpf, CNM  norgestimate-ethinyl estradiol (ORTHO-CYCLEN) 0.25-35 MG-MCG tablet Take 1 tablet by mouth daily. 11/08/20   Anyanwu, Sallyanne Havers, MD  Prenatal Vit-Fe Fumarate-FA (PREPLUS) 27-1 MG TABS Take 1 tablet by mouth daily. 04/30/20   Anyanwu, Sallyanne Havers, MD  sertraline (ZOLOFT) 50 MG tablet Take 1 tablet per day 07/25/21   Rockwell Germany, NP  UBRELVY 50 MG TABS Take 1 tablet at onset of migraine. May repeat in 2 hours if migraine still present. Do not take more than 2 tablets in 24  hour period. 08/01/21   Rockwell Germany, NP  verapamil (CALAN) 40 MG tablet TAKE 2 TABLETS BY MOUTH IN THE MORNING AND IN THE EVENING 06/19/21   Rockwell Germany, NP    Family History Family History  Problem Relation Age of Onset   GER disease Mother    Constipation Mother    Irritable bowel syndrome Mother    Anxiety disorder Mother    Depression Mother    Migraines Mother     Social History Social History   Tobacco Use   Smoking status: Never   Smokeless tobacco: Never  Vaping Use   Vaping Use: Never used  Substance Use Topics   Alcohol use: No    Alcohol/week: 0.0 standard drinks   Drug use: No     Allergies   Patient has no known allergies.   Review of Systems Review of  Systems  Constitutional:  Negative for appetite change, chills and fever.  HENT:  Positive for congestion. Negative for ear pain, rhinorrhea, sinus pressure, sinus pain and sore throat.   Eyes:  Negative for redness and visual disturbance.  Respiratory:  Positive for cough. Negative for chest tightness, shortness of breath and wheezing.   Cardiovascular:  Negative for chest pain and palpitations.  Gastrointestinal:  Negative for abdominal pain, constipation, diarrhea, nausea and vomiting.  Genitourinary:  Negative for dysuria, frequency and urgency.  Musculoskeletal:  Negative for myalgias.  Neurological:  Negative for dizziness, weakness and headaches.  Psychiatric/Behavioral:  Negative for confusion.   All other systems reviewed and are negative.   Physical Exam Triage Vital Signs ED Triage Vitals  Enc Vitals Group     BP 08/09/21 1925 113/73     Pulse Rate 08/09/21 1925 80     Resp 08/09/21 1925 18     Temp 08/09/21 1925 98.9 F (37.2 C)     Temp src --      SpO2 08/09/21 1925 95 %     Weight 08/09/21 1921 201 lb (91.2 kg)     Height --      Head Circumference --      Peak Flow --      Pain Score 08/09/21 1922 0     Pain Loc --      Pain Edu? --      Excl. in [place]? --    No data found.  Updated Vital Signs BP 113/73    Pulse 80    Temp 98.9 F (37.2 C)    Resp 18    Wt 201 lb (91.2 kg)    LMP 07/26/2021    SpO2 95%    BMI 33.45 kg/m   Visual Acuity Right Eye Distance:   Left Eye Distance:   Bilateral Distance:    Right Eye Near:   Left Eye Near:    Bilateral Near:     Physical Exam Vitals reviewed.  Constitutional:      General: She is not in acute distress.    Appearance: Normal appearance. She is not ill-appearing.  HENT:     Head: Normocephalic and atraumatic.     Right Ear: Tympanic membrane, ear canal and external ear normal. No tenderness. No middle ear effusion. There is no impacted cerumen. Tympanic membrane is not perforated, erythematous, retracted  or bulging.     Left Ear: Tympanic membrane, ear canal and external ear normal. No tenderness.  No middle ear effusion. There is no impacted cerumen. Tympanic membrane is not perforated, erythematous, retracted or bulging.  Nose: Nose normal. No congestion.     Mouth/Throat:     Mouth: Mucous membranes are moist.     Pharynx: Uvula midline. No oropharyngeal exudate or posterior oropharyngeal erythema.  Eyes:     Extraocular Movements: Extraocular movements intact.     Pupils: Pupils are equal, round, and reactive to light.  Cardiovascular:     Rate and Rhythm: Normal rate and regular rhythm.     Heart sounds: Normal heart sounds.  Pulmonary:     Effort: Pulmonary effort is normal.     Breath sounds: Normal breath sounds. No decreased breath sounds, wheezing, rhonchi or rales.  Abdominal:     Palpations: Abdomen is soft.     Tenderness: There is no abdominal tenderness. There is no guarding or rebound.  Lymphadenopathy:     Cervical: No cervical adenopathy.     Right cervical: No superficial cervical adenopathy.    Left cervical: No superficial cervical adenopathy.  Neurological:     General: No focal deficit present.     Mental Status: She is alert and oriented to person, place, and time.  Psychiatric:        Mood and Affect: Mood normal.        Behavior: Behavior normal.        Thought Content: Thought content normal.        Judgment: Judgment normal.     UC Treatments / Results  Labs (all labs ordered are listed, but only abnormal results are displayed) Labs Reviewed - No data to display  EKG   Radiology No results found.  Procedures Procedures (including critical care time)  Medications Ordered in UC Medications - No data to display  Initial Impression / Assessment and Plan / UC Course  I have reviewed the triage vital signs and the nursing notes.  Pertinent labs & imaging results that were available during my care of the patient were reviewed by me and  considered in my medical decision making (see chart for details).     This patient is a very pleasant 21 y.o. year old female presenting with viral syndrome. Today this pt is afebrile nontachycardic nontachypneic, oxygenating well on room air, no wheezes rhonchi or rales. States she is not pregnant or breastfeeding.  Given mild symptoms, declined COVID and influenza testing.  I am in agreement.  Promethazine DM and Tessalon sent, continue over-the-counter medications.  School note provided. ED return precautions discussed. Patient verbalizes understanding and agreement.     Final Clinical Impressions(s) / UC Diagnoses   Final diagnoses:  Viral URI with cough     Discharge Instructions      -Promethazine DM cough syrup for congestion/cough. This could make you drowsy, so take at night before bed. -Tessalon (Benzonatate) as needed for cough. Take one pill up to 3x daily (every 8 hours) -You can take Tylenol up to 1000 mg 3 times daily, and ibuprofen up to 600 mg 3 times daily with food.  You can take these together, or alternate every 3-4 hours. -With a virus, you're typically contagious for 5-7 days, or as long as you're having fevers.      ED Prescriptions     Medication Sig Dispense Auth. Provider   benzonatate (TESSALON) 100 MG capsule Take 1 capsule (100 mg total) by mouth every 8 (eight) hours. 21 capsule Rhys Martini, PA-C   promethazine-dextromethorphan (PROMETHAZINE-DM) 6.25-15 MG/5ML syrup Take 5 mLs by mouth 4 (four) times daily as needed for cough. 118 mL Rhys Martini,  PA-C   ibuprofen (ADVIL) 600 MG tablet Take 1 tablet (600 mg total) by mouth every 8 (eight) hours as needed. 30 tablet Hazel Sams, PA-C      PDMP not reviewed this encounter.   Hazel Sams, PA-C 08/09/21 1959

## 2021-08-20 ENCOUNTER — Telehealth (INDEPENDENT_AMBULATORY_CARE_PROVIDER_SITE_OTHER): Payer: Self-pay | Admitting: Family

## 2021-08-20 NOTE — Telephone Encounter (Signed)
I called and spoke with Mom. She asked for a note for [name] for missing school today.I will write the letter and Mom will pick it up tomorrow. TG

## 2021-08-20 NOTE — Telephone Encounter (Signed)
°  Who's calling (name and relationship to patient) : Jayson Mcnease; mom  Best contact number: [phone]  Provider they see: Terance Hart  Reason for call: Mom has called in wanting to speak with Otila Kluver and stated that Mechille  has a terrible migraine, and she is in college; she had to stay home today.     PRESCRIPTION REFILL ONLY  Name of prescription:  Pharmacy:

## 2021-08-20 NOTE — Telephone Encounter (Signed)
HEADACHES  Acute Headache   When did the headache begin? This morning around 6AM    Describe the pain. 10/10  at the temples  Is photosensitivity present? yes   Is the visual disturbed? If so, how? Yes blurry vision   Does noise worsen the headache? yes   Does activity worsen or relieve the headache? worsen   Is nausea present? yes   Rate the headache on a 0-10 scale. 10   Have medication been tried? If so, what medication, what dose, when was the medication given. If this is a prolonged headache, how many doses have been given? Bernita Raisin 50mg  Verapamil 40mg     Have any other treatments been tried? Has anything relieved the headache or made it worse? No, nothing has improved.

## 2021-08-22 DIAGNOSIS — G43909 Migraine, unspecified, not intractable, without status migrainosus: Secondary | ICD-10-CM | POA: Diagnosis not present

## 2021-08-22 DIAGNOSIS — J209 Acute bronchitis, unspecified: Secondary | ICD-10-CM | POA: Diagnosis not present

## 2021-08-23 ENCOUNTER — Telehealth (INDEPENDENT_AMBULATORY_CARE_PROVIDER_SITE_OTHER): Payer: Self-pay | Admitting: Family

## 2021-08-23 DIAGNOSIS — G43701 Chronic migraine without aura, not intractable, with status migrainosus: Secondary | ICD-10-CM

## 2021-08-23 NOTE — Telephone Encounter (Signed)
Please tell Mom that this is good plan for Raymonde and that I put in the referral to [facility]. They should contact her with an appointment. She can also call them at [phone] to schedule since the referral is in place. Thank you.

## 2021-08-23 NOTE — Telephone Encounter (Signed)
°  Who's calling (name and relationship to patient) : Maicey Desautel; mom  Best contact number: [phone]  Provider they see: [place]   Reason for call: Mom has requested for Lindsay [name] to refer Lindsay [name] to an adult Neurologist with in the [facility] system. Mom has requested a call back.    PRESCRIPTION REFILL ONLY  Name of prescription:  Pharmacy:

## 2021-08-23 NOTE — Telephone Encounter (Signed)
Left HIPAA compliant message with mom letting her know referral was sent to [facility].

## 2021-08-26 NOTE — Telephone Encounter (Signed)
Please call mom back. She has a few questions

## 2021-08-29 ENCOUNTER — Ambulatory Visit: Payer: Medicaid Other | Admitting: Psychiatry

## 2021-08-29 ENCOUNTER — Encounter: Payer: Self-pay | Admitting: Psychiatry

## 2021-08-29 VITALS — BP 118/72 | HR 76 | Ht 67.0 in | Wt 199.0 lb

## 2021-08-29 DIAGNOSIS — G43019 Migraine without aura, intractable, without status migrainosus: Secondary | ICD-10-CM | POA: Diagnosis not present

## 2021-08-29 MED ORDER — UBRELVY 100 MG PO TABS: 100.0000 mg | ORAL_TABLET | ORAL | 3 refills | Status: DC | PRN

## 2021-08-29 MED ORDER — GABAPENTIN 300 MG PO CAPS: ORAL_CAPSULE | ORAL | 3 refills | Status: DC

## 2021-08-29 NOTE — Progress Notes (Signed)
Referring:  [name], Tina, NP [address] [place] [id number]  PCP: System, Provider Not In  Neurology was asked to evaluate Lindsay [name], a 21 year old female for a chief complaint of headaches.  Our recommendations of care will be communicated by shared medical record.    CC:  headaches  HPI:  Medical co-morbidities: Asthma, ADHD  The patient presents for evaluation of headaches which began when she was 21 years old. She is currently having 14-15 migraines per month, which can last 2-3 days at a time. Migraines are associated with photophobia, phonophobia, and nausea. They are debilitating to the point where she has had to miss several days of school.  She previously followed with pediatric neurology for migraine management. She is taking Ubrelvy 50 mg PRN for rescue which does not seem to be effective. She has taken verapamil for prevention for several years but it also does not seem to be helping. Started Emgality ~2 months ago and hasn't noticed much of a difference yet.  Headache History: Onset: 21 years old Triggers: smells, bright lights, getting overheated Aura: blurred vision Location: front Quality/Description: throbbing Associated Symptoms:  Photophobia: yes  Phonophobia: yes  Nausea: yes Worse with activity?: yes Duration of headaches: 2-3 days  Headache days per month: 15 Headache free days per month: 15  Current Treatment: Abortive Ubrelvy 50 mg PRN  Preventative Emgality Verapamil  Prior Therapies                                 Ubrelvy 50 mg PRN Verapamil 80 mg BID Emgality 120 mg monthly Depakote Cyproheptadine Topamax Amitriptyline Zoloft Imitrex Promethazine  LABS: CBC    Component Value Date/Time   WBC 7.6 10/10/2020 1503   WBC 14.2 (H) 09/10/2020 1342   RBC 3.78 10/10/2020 1503   RBC 3.22 (L) 09/10/2020 1342   HGB 11.5 10/10/2020 1503   HCT 33.9 (L) 10/10/2020 1503   PLT 314 10/10/2020 1503   MCV 90  10/10/2020 1503   MCH 30.4 10/10/2020 1503   MCH 32.0 09/10/2020 1342   MCHC 33.9 10/10/2020 1503   MCHC 34.0 09/10/2020 1342   RDW 12.4 10/10/2020 1503   LYMPHSABS 2.1 02/16/2020 1545   MONOABS 672 07/11/2016 1637   EOSABS 0.1 02/16/2020 1545   BASOSABS 0.0 02/16/2020 1545   CMP Latest Ref Rng & Units 09/10/2020 02/16/2020 07/11/2016  Glucose 70 - 99 mg/dL 95 604(V119(H) -  BUN 6 - 20 mg/dL 5(L) 6 -  Creatinine 4.090.44 - 1.00 mg/dL 8.11(B1.07(H) 1.470.75 -  Sodium 135 - 145 mmol/L 137 137 -  Potassium 3.5 - 5.1 mmol/L 3.8 4.9 -  Chloride 98 - 111 mmol/L 106 101 -  CO2 22 - 32 mmol/L 21(L) 21 -  Calcium 8.9 - 10.3 mg/dL 9.0 82.910.3 -  Total Protein 6.5 - 8.1 g/dL 5.6(O5.7(L) 7.5 -  Total Bilirubin 0.3 - 1.2 mg/dL 0.9 0.5 -  Alkaline Phos 38 - 126 U/L 156(H) 81 -  AST 15 - 41 U/L 19 12 -  ALT 0 - 44 U/L 13 7 10      IMAGING:  [facility] 2011: unremarkable  Imaging independently reviewed on August 29, 2021   Current Outpatient Medications on File Prior to Visit  Medication Sig Dispense Refill   acetaminophen (TYLENOL) 500 MG tablet Take 1,000 mg by mouth every 6 (six) hours as needed for mild pain or headache.  benzonatate (TESSALON) 100 MG capsule Take 1 capsule (100 mg total) by mouth every 8 (eight) hours. 21 capsule 0   EMGALITY 120 MG/ML SOAJ Inject 120 mg into the skin every 30 (thirty) days. 1 mL 5   ibuprofen (ADVIL) 600 MG tablet Take 1 tablet (600 mg total) by mouth every 8 (eight) hours as needed. 30 tablet 0   medroxyPROGESTERone (DEPO-PROVERA) 150 MG/ML injection Inject 1 mL (150 mg total) into the muscle every 3 (three) months. 1 mL 3   norgestimate-ethinyl estradiol (ORTHO-CYCLEN) 0.25-35 MG-MCG tablet Take 1 tablet by mouth daily. 28 tablet 0   Prenatal Vit-Fe Fumarate-FA (PREPLUS) 27-1 MG TABS Take 1 tablet by mouth daily. 30 tablet 13   promethazine-dextromethorphan (PROMETHAZINE-DM) 6.25-15 MG/5ML syrup Take 5 mLs by mouth 4 (four) times daily as needed for cough. 118 mL 0   sertraline  (ZOLOFT) 50 MG tablet Take 1 tablet per day 30 tablet 1   verapamil (CALAN) 40 MG tablet TAKE 2 TABLETS BY MOUTH IN THE MORNING AND IN THE EVENING 124 tablet 5   No current facility-administered medications on file prior to visit.     Allergies: No Known Allergies  Family History: Migraine or other headaches in the family:  Mother has severe refractory migraines. Almost all females in her family have migraines Aneurysms in a first degree relative:  no Brain tumors in the family:  no Other neurological illness in the family:   no  Past Medical History: Past Medical History:  Diagnosis Date   Attention deficit disorder with hyperactivity(314.01) 07/05/2013   Episodic tension type headache 11/01/2014   Intermittent asthma 11/21/2013   Triggered by seasonal allergies & occasional exercise induced.    Keloid of skin 04/19/2019   Medical history non-contributory    Migraine without aura 05/09/2013   Seasonal allergies     Past Surgical History Past Surgical History:  Procedure Laterality Date   NO PAST SURGERIES      Social History: Social History   Tobacco Use   Smoking status: Never   Smokeless tobacco: Never  Vaping Use   Vaping Use: Never used  Substance Use Topics   Alcohol use: No    Alcohol/week: 0.0 standard drinks   Drug use: No    ROS: Negative for fevers, chills. Positive for headaches. All other systems reviewed and negative unless stated otherwise in HPI.   Physical Exam:   Vital Signs: BP 118/72    Pulse 76    Ht 5\' 7"  (1.702 m)    Wt 199 lb (90.3 kg)    BMI 31.17 kg/m  GENERAL: well appearing,in no acute distress,alert SKIN:  Color, texture, turgor normal. No rashes or lesions HEAD:  Normocephalic/atraumatic. CV:  RRR RESP: Normal respiratory effort MSK:  +mild tenderness to palpation over bilateral occiput, neck, or shoulders  NEUROLOGICAL: Mental Status: Alert, oriented to person, place and time,Follows commands Cranial Nerves: PERRL,visual  fields intact to confrontation,extraocular movements intact,facial sensation intact,no facial droop or ptosis,hearing grossy intact,no dysarthria Motor: muscle strength 5/5 both upper and lower extremities,no drift, normal tone Reflexes: 2+ throughout Sensation: intact to light touch all 4 extremities Coordination: Finger-to- nose-finger intact bilaterally Gait: normal-based   IMPRESSION: 21 year old female with a history of asthma, ADHD who presents for evaluation of migraines. She has failed multiple preventive medications at this point. Most recently started [facility] and has had 2 injections so far. Will continue this for now to allow more time for it to build in her system. Will wean  off of verapamil as she has taken this long term without much improvement. In the meantime will start gabapentin for headache prevention. Ubrelvy increased to 100 mg PRN, encouraged her to take a second dose if headaches don't improve within 2 hours.  PLAN: -Prevention: Wean off of verapamil. Continue Emgality. Start gabapentin (300 mg QHS x1 week, then 300 mg BID x1 week, then 300 mg TID) -Rescue: Increase Ubrelvy to 100 mg PRN -Next steps: consider qulipta, Vyepti, Botox (patient would prefer to avoid Botox if able)  I spent a total of 42 minutes chart reviewing and counseling the patient. Headache education was done. Discussed treatment options including preventive and acute medications, natural supplements, and physical therapy. Discussed medication side effects, adverse reactions and drug interactions. Written educational materials and patient instructions outlining all of the above were given.  Follow-up: 3 months   Ocie Doyne, MD 08/29/2021   3:43 PM

## 2021-08-29 NOTE — Patient Instructions (Addendum)
Start gabapentin for headache prevention. Take 300 mg at bedtime for one week, then increase to 300 mg twice a day for one week, then increase to 300 mg three times a day Decrease verapamil to 40 mg twice a day for a week, then 40 mg at bedtime for a week, then stop Increase Ubrelvy to 100 mg as needed for migraines. Can take a second dose after 2 hours if headache persists

## 2021-08-30 NOTE — Telephone Encounter (Signed)
Left voicemail to call back so we may discuss questions.

## 2021-09-04 ENCOUNTER — Ambulatory Visit: Payer: Medicaid Other

## 2021-09-04 ENCOUNTER — Encounter: Payer: Self-pay | Admitting: Psychiatry

## 2021-09-04 ENCOUNTER — Telehealth: Payer: Self-pay | Admitting: Psychiatry

## 2021-09-04 NOTE — Telephone Encounter (Signed)
Dr. Delena Bali advised she would give the patient a letter if she missed school due to her migranes. The patient was out yesterday 2/7 and today 2/8 and is requesting a letter. Her mom can pick it up and can be called. She looks like she is active on mychart but her mom isn't sure so you may be able to send that way.

## 2021-09-04 NOTE — Telephone Encounter (Signed)
I sent a letter to her via [place], thanks

## 2021-09-17 ENCOUNTER — Other Ambulatory Visit (INDEPENDENT_AMBULATORY_CARE_PROVIDER_SITE_OTHER): Payer: Self-pay | Admitting: Family

## 2021-09-17 DIAGNOSIS — F411 Generalized anxiety disorder: Secondary | ICD-10-CM

## 2021-09-26 DIAGNOSIS — Z0271 Encounter for disability determination: Secondary | ICD-10-CM

## 2021-09-30 ENCOUNTER — Telehealth (INDEPENDENT_AMBULATORY_CARE_PROVIDER_SITE_OTHER): Payer: Medicaid Other | Admitting: Family Medicine

## 2021-09-30 ENCOUNTER — Encounter: Payer: Self-pay | Admitting: Family Medicine

## 2021-09-30 ENCOUNTER — Other Ambulatory Visit: Payer: Self-pay

## 2021-09-30 DIAGNOSIS — Z30011 Encounter for initial prescription of contraceptive pills: Secondary | ICD-10-CM

## 2021-09-30 MED ORDER — ELLA 30 MG PO TABS: 1.0000 | ORAL_TABLET | Freq: Once | ORAL | 1 refills | Status: AC

## 2021-09-30 MED ORDER — NORGESTIMATE-ETH ESTRADIOL 0.25-35 MG-MCG PO TABS: 1.0000 | ORAL_TABLET | Freq: Every day | ORAL | 13 refills | Status: DC

## 2021-09-30 NOTE — Progress Notes (Signed)
Wants to go back on pills, last depo was in November  ?

## 2021-09-30 NOTE — Progress Notes (Signed)
? ?  TELEHEALTH GYNECOLOGY VISIT ENCOUNTER NOTE ? ?Provider location: [facility] [employer] [facility]  ? ?Patient location: was in [place] with ear buds and then In the car, not driving.  ? ?I connected with Shermika Balthaser on 10/02/21 at  4:10 PM EST by telephone and verified that I am speaking with the correct person using two identifiers.  ?  ?I discussed the limitations, risks, security and privacy concerns of performing an evaluation and management service by telephone and the availability of in person appointments. I also discussed with the patient that there may be a patient responsible charge related to this service. The patient expressed understanding and agreed to proceed. ?  ?History:  ?Timya Trimmer is a 21 y.o. G41P1001 female being evaluated today for her desire to restart a contraceptive method. She was on depo, last received in Nov.  ? ?LMP was 02/22/22 ? ?She denies any abnormal vaginal discharge, bleeding, pelvic pain or other concerns.   ?  ?  ?Past Medical History:  ?Diagnosis Date  ? Attention deficit disorder with hyperactivity(314.01) 07/05/2013  ? Episodic tension type headache 11/01/2014  ? Intermittent asthma 11/21/2013  ? Triggered by seasonal allergies & occasional exercise induced.   ? Keloid of skin 04/19/2019  ? Medical history non-contributory   ? Migraine without aura 05/09/2013  ? Seasonal allergies   ? ?Past Surgical History:  ?Procedure Laterality Date  ? NO PAST SURGERIES    ? ?The following portions of the patient's history were reviewed and updated as appropriate: allergies, current medications, past family history, past medical history, past social history, past surgical history and problem list.  ? ?Health Maintenance:  UTD on CT screening ? ?Review of Systems:  ?Pertinent items noted in HPI and remainder of comprehensive ROS otherwise negative. ? ?Physical Exam:  ? ?General:  Alert, oriented and cooperative.   ?Mental Status: Normal mood and affect perceived. Normal  judgment and thought content.  ?Physical exam deferred due to nature of the encounter ? ?Labs and Imaging ?No results found for this or any previous visit (from the past 336 hour(s)). ?No results found.    ?Assessment and Plan:  ?   ?1. Encounter for initial prescription of contraceptive pills ?Reviewed options in a patient centered and with shared decision making ?Patient interested in COP ?Reviewed role of having ECP for missed pills- accepts this today  ?- norgestimate-ethinyl estradiol (ORTHO-CYCLEN) 0.25-35 MG-MCG tablet; Take 1 tablet by mouth daily.  Dispense: 28 tablet; Refill: 13 ?- ulipristal acetate (ELLA) 30 MG tablet; Take 1 tablet (30 mg total) by mouth once for 1 dose.  Dispense: 1 tablet; Refill: 1 ?   ?  ?I discussed the assessment and treatment plan with the patient. The patient was provided an opportunity to ask questions and all were answered. The patient agreed with the plan and demonstrated an understanding of the instructions. ?  ?The patient was advised to call back or seek an in-person evaluation/go to the ED if the symptoms worsen or if the condition fails to improve as anticipated. ? ?I provided 15 minutes of non-face-to-face time during this encounter. ? ? ?Federico Flake, MD ?Center for [facility], [facility] ? ?

## 2021-10-02 ENCOUNTER — Encounter: Payer: Self-pay | Admitting: Family Medicine

## 2021-12-05 ENCOUNTER — Ambulatory Visit: Payer: Medicaid Other | Admitting: Psychiatry

## 2021-12-13 IMAGING — US US OB COMP LESS 14 WK
1 series · 14 of 28 positions shown · non-contrast
Comparison: none

[Series 1: us ob comp less 14 wk · 14 of 62 slices shown]
[im 3/62]
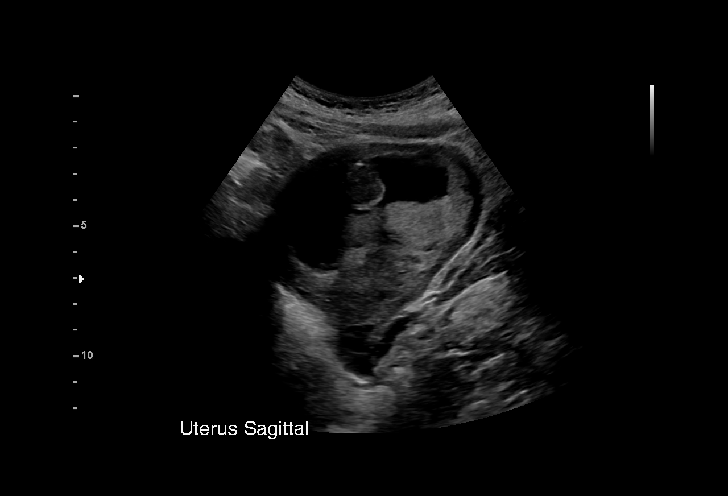
[im 7/62]
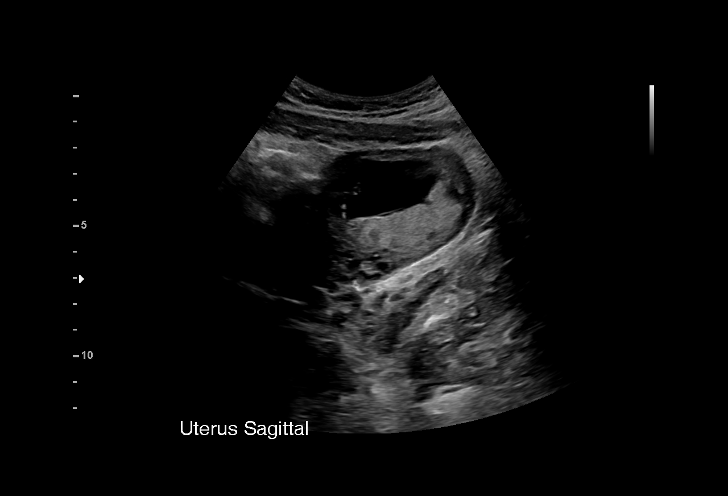
[im 12/62]
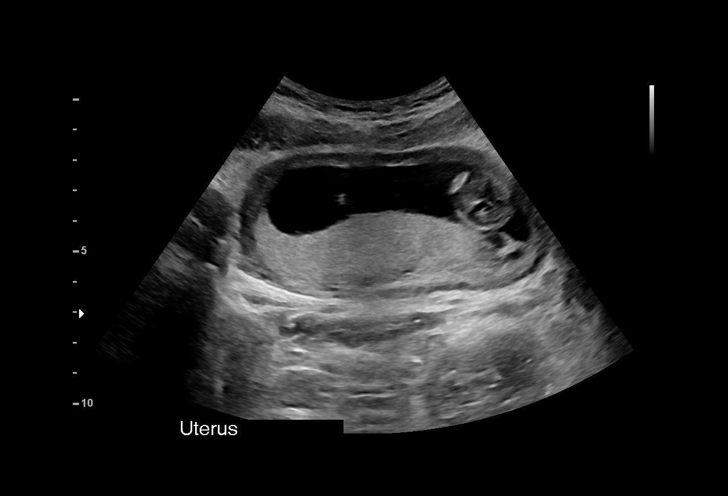
[im 16/62]
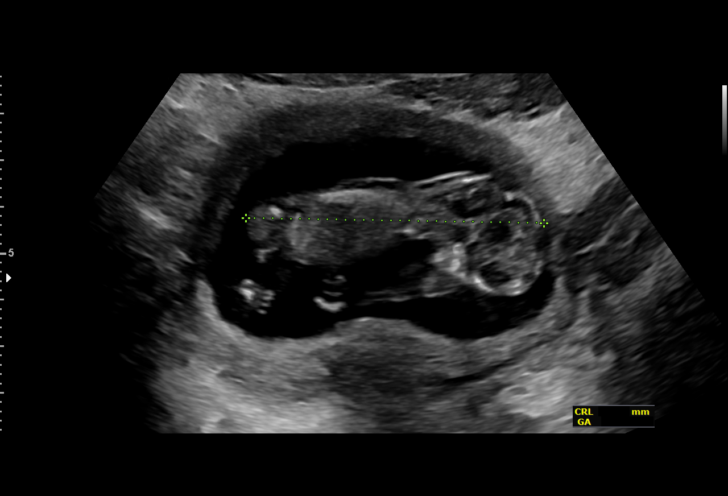
[im 21/62]
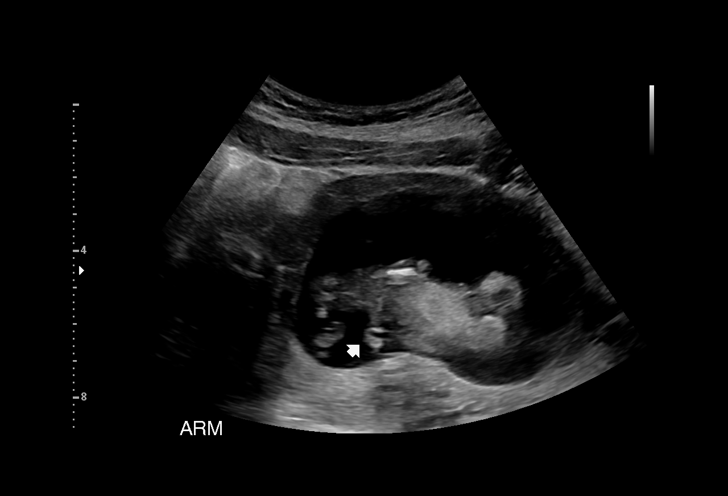
[im 25/62]
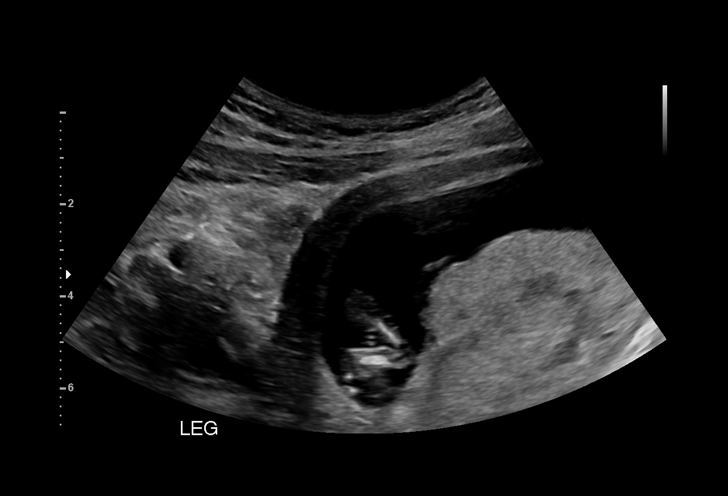
[im 30/62]
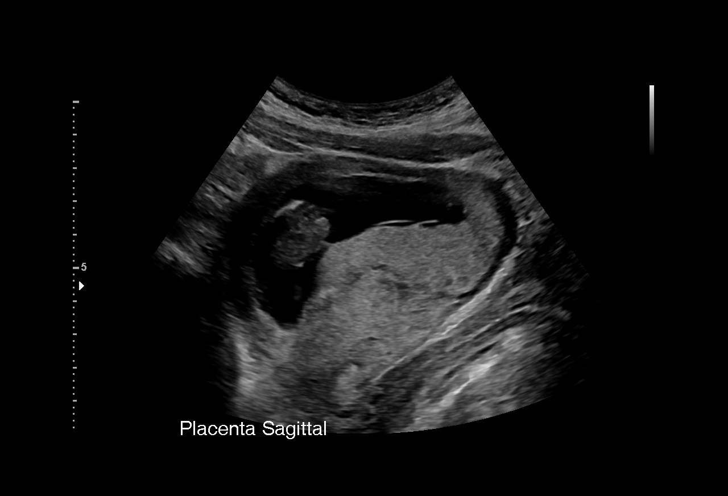
[im 34/62]
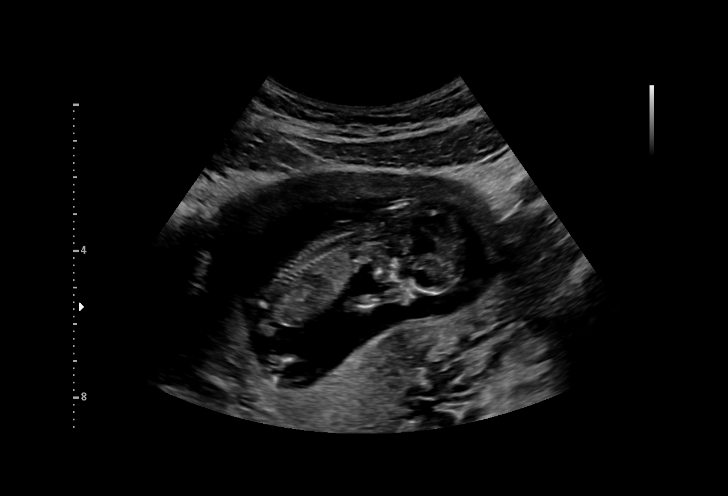
[im 39/62]
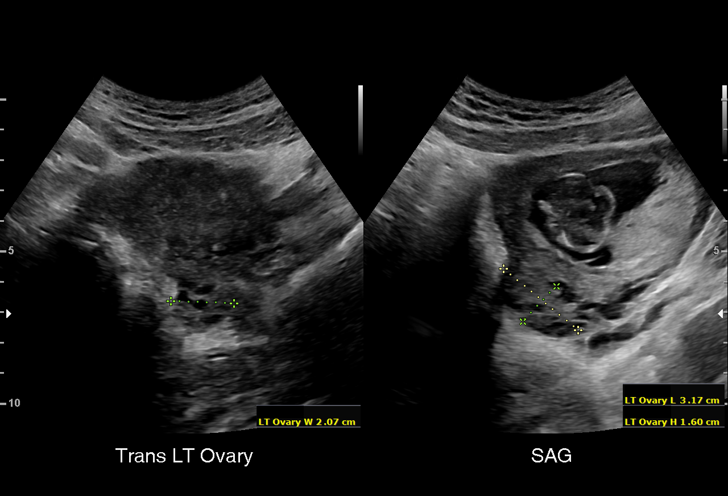
[im 43/62]
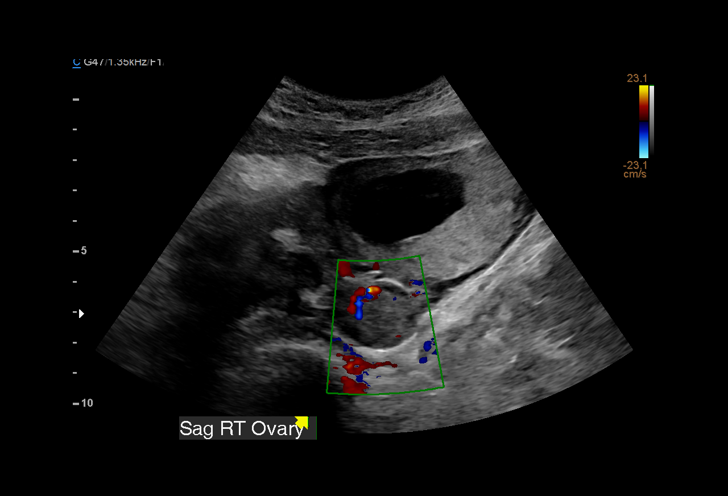
[im 48/62]
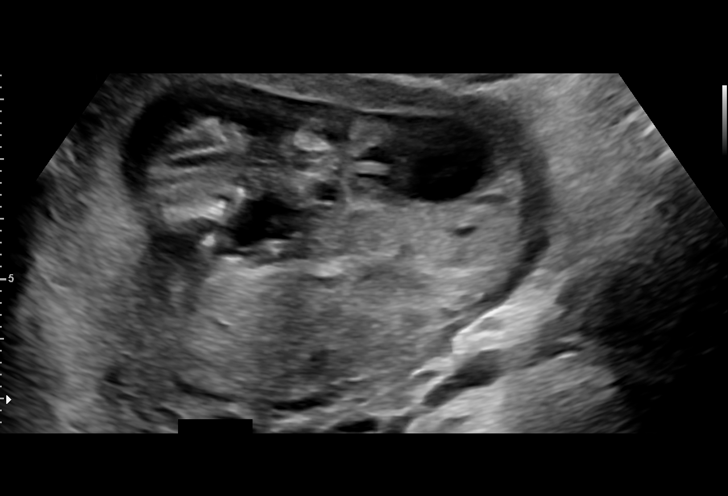
[im 52/62]
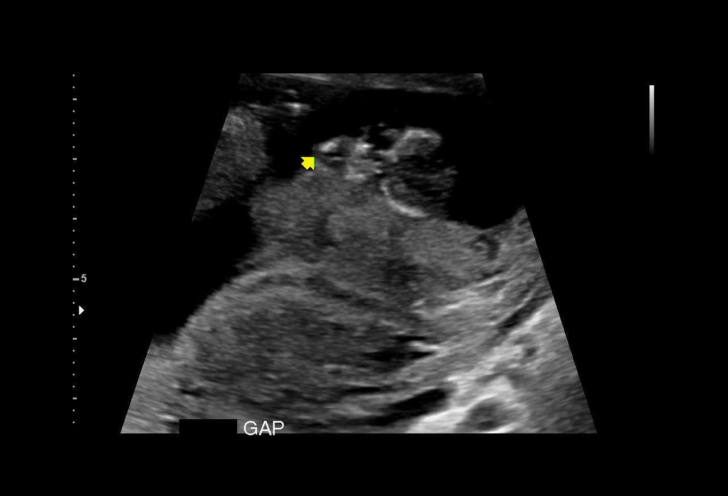
[im 57/62]
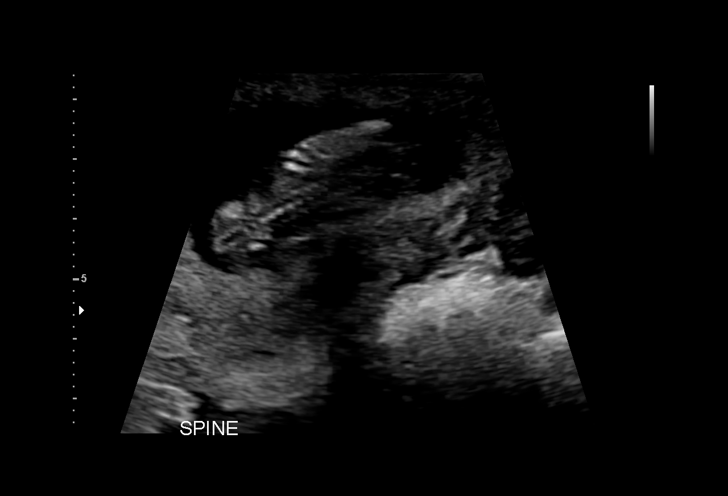
[im 62/62]
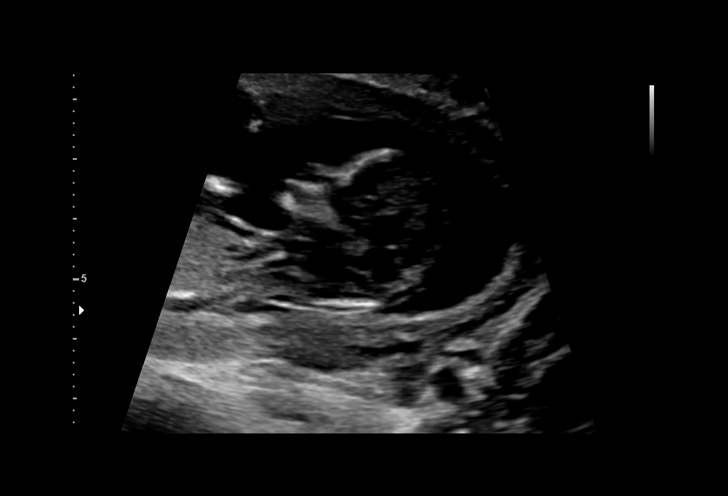

[14 of 28 positions shown; findings below may reference images not displayed]

ANATOMY

Indications

 Teratogen exposure in pregnancy,
 antepartum,not applicable or unspecified
 fetus
 12 weeks gestation of pregnancy
 Teen pregnancy
 LR NIPS
Vital Signs

                                                Height:        5'5"
Fetal Evaluation

 Num Of Fetuses:         1
 Fetal Heart Rate(bpm):  142
 Cardiac Activity:       Observed
 Presentation:           Transverse, head to maternal left
 Placenta:               Posterior

 Amniotic Fluid
 AFI FV:      Within normal limits
Biometry

 CRL:     64.05  mm     G. Age:  12w 4d                  EDD:   09/09/20
OB History

 Gravidity:    1
Gestational Age

 LMP:           12w 6d        Date:  12/02/19                 EDD:   09/07/20
 Best:          12w 6d     Det. By:  LMP  (12/02/19)          EDD:   09/07/20
Anatomy

 Cranium:               Appears normal         Cord Vessels:           Appears normal (3
                                                                       vessel cord)
 Choroid Plexus:        Appears normal         Bladder:                Appears normal
 Face:                  Appears normal         Spine:                  Appears normal
                        (orbits and profile)
 Stomach:               Appears normal, left   Upper Extremities:      Visualized
                        sided
 Abdominal Wall:        Appears nml (cord      Lower Extremities:      Visualized
                        insert, abd wall)
Cervix Uterus Adnexa

 Cervix
 Normal appearance by transabdominal scan.

 Uterus
 No abnormality visualized.

 Right Ovary
 Size(cm)     3.61   x   2.51   x  2.43      Vol(ml):
 Within normal limits.

 Left Ovary
 Size(cm)     3.17   x   2.07   x  1.6       Vol(ml):
 Within normal limits.
Impression

 Patient is here for ultrasound.  She was taking Depakote
 (valproic acid) for headaches till 8 weeks gestation.

 On cell-free fetal DNA screening, the risks of fetal
 aneuploidies are not increased .

 On ultrasound, the CRL measurement is consistent with her
 previously-established dates and good fetal heart activity is
 seen. The nuchal translucency (NT) appears normal (not
 measured). Intracranial translucency appears normal (low
 risk for spina bifida). No obvious facial anomalies seen.  Fetal
 anatomy that could be ascertained at this gestational age is
 normal.

 Valproic exposure between 17th and 30th the day after
 fertilization is associated with with a 1% to 2% chance having
 open  neural tube defects.  Facial dysmorphism can also
 occur.  Patient was informed that only detailed fetal
 anatomical survey between 18 and 20 weeks will detect some
 (not all) anomalies.
Recommendations

 -An appointment was made for her to return in 6 weeks for
 fetal anatomy scan.
 -MSAFP screening should be performed between 15 and 20
 weeks.
                 Meza, Richelet

## 2021-12-19 ENCOUNTER — Other Ambulatory Visit: Payer: Self-pay | Admitting: Psychiatry

## 2021-12-19 DIAGNOSIS — G43019 Migraine without aura, intractable, without status migrainosus: Secondary | ICD-10-CM

## 2021-12-20 ENCOUNTER — Other Ambulatory Visit (INDEPENDENT_AMBULATORY_CARE_PROVIDER_SITE_OTHER): Payer: Self-pay

## 2022-01-07 NOTE — Progress Notes (Unsigned)
   CC:  headaches  Follow-up Visit  Last visit: 08/29/21  Brief HPI: 21 year old female with a history of asthma and ADHD who follows in clinic for migraines.  At her last visit she was continued on Emgality for headache prevention. She was weaned off of verapamil. Gabapentin was started for headache prevention. Bernita Raisin was increased to 100 mg PRN. Interval History: ***   Headache days per month: *** Headache free days per month: *** Headache severity: ***  Current Headache Regimen: Preventative: *** Abortive: ***  # of doses of abortive medications per month: ***  Prior Therapies                                  Ubrelvy 100 mg PRN Verapamil 80 mg BID Emgality 120 mg monthly gabapentin Depakote Cyproheptadine Topamax Amitriptyline Zoloft Imitrex Promethazine  Physical Exam:   Vital Signs: There were no vitals taken for this visit. GENERAL:  well appearing, in no acute distress, alert  SKIN:  Color, texture, turgor normal. No rashes or lesions HEAD:  Normocephalic/atraumatic. RESP: normal respiratory effort MSK:  No gross joint deformities.   NEUROLOGICAL: Mental Status: Alert, oriented to person, place and time, Follows commands, and Speech fluent and appropriate. Cranial Nerves: PERRL, face symmetric, no dysarthria, hearing grossly intact Motor: moves all extremities equally Gait: normal-based.  IMPRESSION: ***  PLAN: *** -consider qulipta, vyepti, botox, alternate CGRP  Follow-up: ***  I spent a total of *** minutes on the date of the service. Headache education was done. Discussed lifestyle modification including increased oral hydration, decreased caffeine, exercise and stress management. Discussed treatment options including preventive and acute medications, natural supplements, and infusion therapy. Discussed medication overuse headache and to limit use of acute treatments to no more than 2 days/week or 10 days/month. Discussed medication side  effects, adverse reactions and drug interactions. Written educational materials and patient instructions outlining all of the above were given.  Ocie Doyne, MD

## 2022-01-08 ENCOUNTER — Ambulatory Visit: Payer: Medicaid Other | Admitting: Psychiatry

## 2022-01-08 ENCOUNTER — Encounter: Payer: Self-pay | Admitting: Psychiatry

## 2022-01-08 VITALS — BP 115/65 | HR 78 | Ht 67.0 in | Wt 199.0 lb

## 2022-01-08 DIAGNOSIS — G43019 Migraine without aura, intractable, without status migrainosus: Secondary | ICD-10-CM | POA: Diagnosis not present

## 2022-01-08 MED ORDER — GABAPENTIN 300 MG PO CAPS: 300.0000 mg | ORAL_CAPSULE | Freq: Every day | ORAL | 6 refills | Status: DC

## 2022-01-08 MED ORDER — QULIPTA 60 MG PO TABS: 60.0000 mg | ORAL_TABLET | Freq: Every day | ORAL | 6 refills | Status: DC

## 2022-01-08 MED ORDER — GABAPENTIN 300 MG PO CAPS: 300.0000 mg | ORAL_CAPSULE | Freq: Every day | ORAL | 5 refills | Status: DC

## 2022-01-08 MED ORDER — RIZATRIPTAN BENZOATE 10 MG PO TBDP: 10.0000 mg | ORAL_TABLET | ORAL | 11 refills | Status: DC | PRN

## 2022-01-08 NOTE — Patient Instructions (Signed)
Start Qulipta 60 mg daily Start Maxalt (rizatriptan) as needed. Take at the onset of migraine. If headache recurs or does not fully resolve, you may take a second dose after 2 hours. Please avoid taking more than 2 days per week to avoid rebound headaches Take gabapentin 300 mg nightly at bedtime, can take an extra dose as needed for headache

## 2022-01-27 ENCOUNTER — Telehealth: Payer: Self-pay

## 2022-01-27 NOTE — Telephone Encounter (Signed)
This request has received a Favorable outcome.  Please note any additional information provided by [employer] [place] [employer] at the bottom of this request. 

## 2022-01-27 NOTE — Telephone Encounter (Signed)
PA for qulipta has been sent.  (Key: [id number])  Your information has been sent to [facility].

## 2022-02-18 NOTE — Telephone Encounter (Signed)
error 

## 2022-03-06 ENCOUNTER — Ambulatory Visit (INDEPENDENT_AMBULATORY_CARE_PROVIDER_SITE_OTHER): Payer: Medicaid Other | Admitting: Advanced Practice Midwife

## 2022-03-06 ENCOUNTER — Encounter: Payer: Self-pay | Admitting: Advanced Practice Midwife

## 2022-03-06 VITALS — BP 119/70 | HR 106 | Wt 201.0 lb

## 2022-03-06 DIAGNOSIS — M545 Low back pain, unspecified: Secondary | ICD-10-CM

## 2022-03-06 DIAGNOSIS — R102 Pelvic and perineal pain: Secondary | ICD-10-CM

## 2022-03-06 MED ORDER — KETOROLAC TROMETHAMINE 10 MG PO TABS: 10.0000 mg | ORAL_TABLET | Freq: Four times a day (QID) | ORAL | 0 refills | Status: DC | PRN

## 2022-03-06 NOTE — Progress Notes (Signed)
Bad back pain and cramps since having her baby , denies any vaginal discharge, abnormal bleeding, or urinary symptoms

## 2022-03-07 NOTE — Progress Notes (Addendum)
GYNECOLOGY ANNUAL PREVENTATIVE CARE ENCOUNTER NOTE  History:     Lindsay [name] is a 21 y.o. G6P1001 female here for a routine annual gynecologic exam.  Current complaints: recurrent pelvic and low back pain, onset with her vaginal birth 09/10/2020. Patient states she has seen her PCP/pediatrician and he told her it was from her epidural.  She has attempted treatment with intermittent Motrin but has not experienced relief.  Denies abnormal vaginal bleeding, discharge, pelvic pain, problems with intercourse or other gynecologic concerns.    Gynecologic History No LMP recorded. (Menstrual status: Oral contraceptives). Contraception: OCP (estrogen/progesterone) Last Pap: N?A age 55.   Obstetric History OB History  Gravida Para Term Preterm AB Living  1 1 1     1   SAB IAB Ectopic Multiple Live Births        0 1    # Outcome Date GA Lbr Len/2nd Weight Sex Delivery Anes PTL Lv  1 Term 09/10/20 [redacted]w[redacted]d 03:12 / 00:34 8 lb 0.9 oz (3.655 kg) M Vag-Spont EPI  LIV    Past Medical History:  Diagnosis Date   Attention deficit disorder with hyperactivity(314.01) 07/05/2013   Episodic tension type headache 11/01/2014   Intermittent asthma 11/21/2013   Triggered by seasonal allergies & occasional exercise induced.    Keloid of skin 04/19/2019   Medical history non-contributory    Migraine without aura 05/09/2013   Seasonal allergies     Past Surgical History:  Procedure Laterality Date   NO PAST SURGERIES      Current Outpatient Medications on File Prior to Visit  Medication Sig Dispense Refill   Atogepant (QULIPTA) 60 MG TABS Take 60 mg by mouth daily. 30 tablet 6   EMGALITY 120 MG/ML SOAJ Inject 120 mg into the skin every 30 (thirty) days. 1 mL 5   gabapentin (NEURONTIN) 300 MG capsule Take 1 capsule (300 mg total) by mouth at bedtime. May take an extra dose (300 mg) as needed for migraine headache 60 capsule 5   norgestimate-ethinyl estradiol (ORTHO-CYCLEN) 0.25-35 MG-MCG tablet Take 1  tablet by mouth daily. 28 tablet 13   rizatriptan (MAXALT-MLT) 10 MG disintegrating tablet Take 1 tablet (10 mg total) by mouth as needed for migraine. May repeat in 2 hours if needed 9 tablet 11   sertraline (ZOLOFT) 50 MG tablet Take 1 tablet per day 30 tablet 1   verapamil (CALAN) 40 MG tablet TAKE 2 TABLETS BY MOUTH IN THE MORNING AND IN THE EVENING 124 tablet 5   acetaminophen (TYLENOL) 500 MG tablet Take 1,000 mg by mouth every 6 (six) hours as needed for mild pain or headache.     No current facility-administered medications on file prior to visit.    No Known Allergies  Social History:  reports that she has never smoked. She has never used smokeless tobacco. She reports that she does not drink alcohol and does not use drugs.  Family History  Problem Relation Age of Onset   GER disease Mother    Constipation Mother    Irritable bowel syndrome Mother    Anxiety disorder Mother    Depression Mother    Migraines Mother     The following portions of the patient's history were reviewed and updated as appropriate: allergies, current medications, past family history, past medical history, past social history, past surgical history and problem list.  Review of Systems Pertinent items noted in HPI and remainder of comprehensive ROS otherwise negative.  Physical Exam:  BP 119/70  Pulse (!) 106   Wt 201 lb (91.2 kg)   BMI 31.48 kg/m  CONSTITUTIONAL: Well-developed, well-nourished female in no acute distress.  HENT:  Normocephalic, atraumatic, External right and left ear normal.  EYES: Conjunctivae and EOM are normal. Pupils are equal, round, and reactive to light. No scleral icterus.  NECK: Normal range of motion, supple, no masses.  Normal thyroid. SKIN: Skin is warm and dry. No rash noted. Not diaphoretic. No erythema. No pallor. MUSCULOSKELETAL: Normal range of motion. No tenderness.  No cyanosis, clubbing, or edema. NEUROLOGIC: Alert and oriented to person, place, and time.  Normal reflexes, muscle tone coordination.  PSYCHIATRIC: Normal mood and affect. Normal behavior. Normal judgment and thought content. CARDIOVASCULAR: Normal heart rate noted, regular rhythm RESPIRATORY: Effort normal, no problems with respiration noted. ABDOMEN: Soft, no distention noted.  No tenderness, rebound or guarding.  PELVIC: Deferred   Assessment and Plan:    1. Pelvic pain - Pertinent negatives: abdominal tenderness, dysuria, CVAT, flank pain - S/p vaginal delivery 09/10/2020 - Low suspicion complaints related to birth but will collect TVUS to confirm - US PELVIC COMPLETE WITH TRANSVAGINAL; Future  2. Acute midline low back pain without sciatica - Spinal column is non-tender, no lesions, bulging or activity intolerance to suggest injury - Location of pain reported today not consistent with epidural placement - Discussed patient almost continuous bending, lifting, squatting to chase toddler - Ambulatory referral to Physical Therapy  Will trial short course of stronger anti-inflammatory  Underscored patient should DISCONTINUE MOTRIN WHILE USING TORADOL  Will monitor results of TVUS and PT assessment and coordinate additional care as needed  Clayton Bibles, MSA, MSN, CNM Certified Nurse Midwife, Biochemist, clinical for [employer], [facility]

## 2022-03-26 ENCOUNTER — Ambulatory Visit: Payer: Medicaid Other | Admitting: Physical Therapy

## 2022-03-27 ENCOUNTER — Ambulatory Visit: Payer: Medicaid Other

## 2022-04-02 NOTE — Telephone Encounter (Signed)
Starts on: 01/27/2022 12:00:00 AM, Coverage Ends on: 04/27/2022 12:00:00 AM.

## 2022-04-14 NOTE — Therapy (Deleted)
OUTPATIENT PHYSICAL THERAPY FEMALE PELVIC EVALUATION   Patient Name: Lindsay [name] MRN: [id number] DOB:2000-10-09, 21 y.o., female Today's Date: 04/14/2022    Past Medical History:  Diagnosis Date   Attention deficit disorder with hyperactivity(314.01) 07/05/2013   Episodic tension type headache 11/01/2014   Intermittent asthma 11/21/2013   Triggered by seasonal allergies & occasional exercise induced.    Keloid of skin 04/19/2019   Medical history non-contributory    Migraine without aura 05/09/2013   Seasonal allergies    Past Surgical History:  Procedure Laterality Date   NO PAST SURGERIES     Patient Active Problem List   Diagnosis Date Noted   Chronic migraine without aura with status migrainosus, not intractable 09/28/2020   Supervision of normal first pregnancy 09/09/2020   Supervision of normal first teen pregnancy 02/16/2020   Maternal exposure to teratogen, first trimester 02/16/2020   Keloid of skin 04/19/2019   Episodic tension type headache 11/01/2014   Intermittent asthma 11/21/2013   Attention deficit hyperactivity disorder (ADHD) 07/05/2013   Migraine without aura 05/09/2013    PCP: ***  REFERRING PROVIDER: Darlina Rumpf, CNM  REFERRING DIAG: M54.50 (ICD-10-CM) - Acute midline low back pain without sciatica  THERAPY DIAG:  No diagnosis found.  Rationale for Evaluation and Treatment Rehabilitation  ONSET DATE: vaginal birth on 09/10/20  SUBJECTIVE:                                                                                                                                                                                           SUBJECTIVE STATEMENT: Low back and pelvic pain Fluid intake: {Yes/No:304960894}    PAIN:  Are you having pain? {yes/no:20286} NPRS scale: ***/10 Pain location: {pelvic pain location:27098}  Pain type: {type:313116} Pain description: {PAIN DESCRIPTION:21022940}   Aggravating factors: *** Relieving factors:  ***  PRECAUTIONS: {Therapy precautions:24002}  WEIGHT BEARING RESTRICTIONS {Yes ***/No:24003}  FALLS:  Has patient fallen in last 6 months? {fallsyesno:27318}  LIVING ENVIRONMENT: Lives with: {[facility] lives with:25569::"lives with their family"} Lives in: {Lives in:25570} Stairs: {opstairs:27293} Has following equipment at home: {Assistive devices:23999}  OCCUPATION: ***  PLOF: {PLOF:24004}  PATIENT GOALS ***  PERTINENT HISTORY:  *** Sexual abuse: {Yes/No:304960894}  BOWEL MOVEMENT Pain with bowel movement: {yes/no:20286} Type of bowel movement:{PT BM type:27100} Fully empty rectum: {Yes/No:304960894} Leakage: {Yes/No:304960894} Pads: {Yes/No:304960894} Fiber supplement: {Yes/No:304960894}  URINATION Pain with urination: {yes/no:20286} Fully empty bladder: {Yes/No:304960894} Stream: {PT urination:27102} Urgency: {Yes/No:304960894} Frequency: *** Leakage: {PT leakage:27103} Pads: {Yes/No:304960894}  INTERCOURSE Pain with intercourse: {pain with intercourse PA:27099} Ability to have vaginal penetration:  {Yes/No:304960894} Climax: *** Marinoff Scale: ***/3  PREGNANCY Vaginal deliveries *** Tearing {Yes***/No:304960894} C-section deliveries ***  Currently pregnant {Yes***/No:304960894}  PROLAPSE {PT prolapse:27101}    OBJECTIVE:   DIAGNOSTIC FINDINGS:  ***  PATIENT SURVEYS:  {rehab surveys:24030}  PFIQ-7 ***  COGNITION:  Overall cognitive status: {cognition:24006}     SENSATION:  Light touch: {intact/deficits:24005}  Proprioception: {intact/deficits:24005}  MUSCLE LENGTH: Hamstrings: Right *** deg; Left *** deg Thomas test: Right *** deg; Left *** deg  LUMBAR SPECIAL TESTS:  {lumbar special test:25242}  FUNCTIONAL TESTS:  {Functional tests:24029}  GAIT: Distance walked: *** Assistive device utilized: {Assistive devices:23999} Level of assistance: {Levels of assistance:24026} Comments: ***               POSTURE:  {posture:25561}   PELVIC ALIGNMENT:  LUMBARAROM/PROM  A/PROM A/PROM  eval  Flexion   Extension   Right lateral flexion   Left lateral flexion   Right rotation   Left rotation    (Blank rows = not tested)  LOWER EXTREMITY ROM:  {AROM/PROM:27142} ROM Right eval Left eval  Hip flexion    Hip extension    Hip abduction    Hip adduction    Hip internal rotation    Hip external rotation    Knee flexion    Knee extension    Ankle dorsiflexion    Ankle plantarflexion    Ankle inversion    Ankle eversion     (Blank rows = not tested)  LOWER EXTREMITY MMT:  MMT Right eval Left eval  Hip flexion    Hip extension    Hip abduction    Hip adduction    Hip internal rotation    Hip external rotation    Knee flexion    Knee extension    Ankle dorsiflexion    Ankle plantarflexion    Ankle inversion    Ankle eversion      PALPATION:   General  ***                External Perineal Exam ***                             Internal Pelvic Floor ***  Patient confirms identification and approves PT to assess internal pelvic floor and treatment {yes/no:20286}  PELVIC MMT:   MMT eval  Vaginal   Internal Anal Sphincter   External Anal Sphincter   Puborectalis   Diastasis Recti   (Blank rows = not tested)        TONE: ***  PROLAPSE: ***  TODAY'S TREATMENT  EVAL ***   PATIENT EDUCATION:  Education details: *** Person educated: {Person educated:25204} Education method: {Education Method:25205} Education comprehension: {Education Comprehension:25206}   HOME EXERCISE PROGRAM: ***  ASSESSMENT:  CLINICAL IMPRESSION: Patient is a *** y.o. *** who was seen today for physical therapy evaluation and treatment for ***.    OBJECTIVE IMPAIRMENTS {opptimpairments:25111}.   ACTIVITY LIMITATIONS {activitylimitations:27494}  PARTICIPATION LIMITATIONS: {participationrestrictions:25113}  PERSONAL FACTORS {Personal factors:25162} are also affecting patient's  functional outcome.   REHAB POTENTIAL: {rehabpotential:25112}  CLINICAL DECISION MAKING: {clinical decision making:25114}  EVALUATION COMPLEXITY: {Evaluation complexity:25115}   GOALS: Goals reviewed with patient? {yes/no:20286}  SHORT TERM GOALS: Target date: {follow up:25551}  *** Baseline: Goal status: {GOALSTATUS:25110}  2.  *** Baseline:  Goal status: {GOALSTATUS:25110}  3.  *** Baseline:  Goal status: {GOALSTATUS:25110}  4.  *** Baseline:  Goal status: {GOALSTATUS:25110}  5.  *** Baseline:  Goal status: {GOALSTATUS:25110}  6.  *** Baseline:  Goal status: {GOALSTATUS:25110}  LONG TERM GOALS: Target date: {follow up:25551}   ***  Baseline:  Goal status: {GOALSTATUS:25110}  2.  *** Baseline:  Goal status: {GOALSTATUS:25110}  3.  *** Baseline:  Goal status: {GOALSTATUS:25110}  4.  *** Baseline:  Goal status: {GOALSTATUS:25110}  5.  *** Baseline:  Goal status: {GOALSTATUS:25110}  6.  *** Baseline:  Goal status: {GOALSTATUS:25110}  PLAN: PT FREQUENCY: {rehab frequency:25116}  PT DURATION: {rehab duration:25117}  PLANNED INTERVENTIONS: {rehab planned interventions:25118::"Therapeutic exercises","Therapeutic activity","Neuromuscular re-education","Balance training","Gait training","Patient/Family education","Self Care","Joint mobilization"}  PLAN FOR NEXT SESSION: ***   Camillo Flaming Jasenia Weilbacher, PT 04/14/2022, 5:42 PM

## 2022-04-15 ENCOUNTER — Ambulatory Visit: Payer: Medicaid Other | Admitting: Physical Therapy

## 2022-04-23 ENCOUNTER — Ambulatory Visit (HOSPITAL_COMMUNITY): Payer: Medicaid Other

## 2022-05-05 ENCOUNTER — Telehealth: Payer: Self-pay | Admitting: *Deleted

## 2022-05-05 NOTE — Telephone Encounter (Signed)
[place] PA, Key: [id number]   Case: [id number], Status: Approved, Coverage Starts on: 05/05/2022 12:00:00 AM, Coverage Ends on: 05/05/2023 12:00:00 AM.

## 2022-05-25 NOTE — Therapy (Addendum)
OUTPATIENT PHYSICAL THERAPY FEMALE PELVIC EVALUATION   Patient Name: Lindsay [name] MRN: [id number] DOB:September 07, 2000, 21 y.o., female Today's Date: 05/25/2022    Past Medical History:  Diagnosis Date   Attention deficit disorder with hyperactivity(314.01) 07/05/2013   Episodic tension type headache 11/01/2014   Intermittent asthma 11/21/2013   Triggered by seasonal allergies & occasional exercise induced.    Keloid of skin 04/19/2019   Medical history non-contributory    Migraine without aura 05/09/2013   Seasonal allergies    Past Surgical History:  Procedure Laterality Date   NO PAST SURGERIES     Patient Active Problem List   Diagnosis Date Noted   Chronic migraine without aura with status migrainosus, not intractable 09/28/2020   Supervision of normal first pregnancy 09/09/2020   Supervision of normal first teen pregnancy 02/16/2020   Maternal exposure to teratogen, first trimester 02/16/2020   Keloid of skin 04/19/2019   Episodic tension type headache 11/01/2014   Intermittent asthma 11/21/2013   Attention deficit hyperactivity disorder (ADHD) 07/05/2013   Migraine without aura 05/09/2013    PCP: None  REFERRING PROVIDER: Darlina Rumpf, CNM  REFERRING DIAG: M54.50 (ICD-10-CM) - Acute midline low back pain without sciatica  THERAPY DIAG:  No diagnosis found.  Rationale for Evaluation and Treatment: Rehabilitation  ONSET DATE: 09/10/21  SUBJECTIVE:                                                                                                                                                                                           05/26/22 SUBJECTIVE STATEMENT: Vaginal delivery 09/10/21. She denies pain during pregnancy. She states that low back pain started after delivery when she got home. Patient states that the pain is in bil low back and wraps around Rt side, sometimes on the Lt side.  Fluid intake: Yes: none to one glass    PAIN:  Are you having pain?  Yes NPRS scale: 10/10, currently 6/10, 5-10 minutes Pain location: Deep, Right, Left, and low back/pelvis/abdomen  Pain type: aching, burning, dull, and sharp Pain description: intermittent and constant   Aggravating factors: being more active, sitting after a lot of activity, lying down too long Relieving factors: sometimes lying down, sometimes heat  PRECAUTIONS: None  WEIGHT BEARING RESTRICTIONS: No  FALLS:  Has patient fallen in last 6 months? No  LIVING ENVIRONMENT: Lives with: lives with their family Lives in: House/apartment  OCCUPATION: does not work outside the home  PLOF: Independent  PATIENT GOALS: decrease pain, able to take care of son without difficulty  PERTINENT HISTORY:  Vaginal delivery 09/10/20, history of heavy/painful menstrual cycles  Sexual abuse: No  BOWEL  MOVEMENT: Pain with bowel movement: No Type of bowel movement:Frequency every other day and Strain Yes Fully empty rectum: Yes: most of the time Leakage: No Pads: No Fiber supplement: No  URINATION: Pain with urination: No Fully empty bladder: Yes: - Stream: Strong Urgency: No Frequency: every 1-2 hours Leakage:  random, post-void dribbling Pads: No, uses tissue  INTERCOURSE: Pain with intercourse:  None Ability to have vaginal penetration:  Yes: - Climax: yes Marinoff Scale: 0/3  PREGNANCY: Vaginal deliveries 1 Tearing Yes: grade 2 C-section deliveries 0 Currently pregnant No  PROLAPSE:  No report of heaviness  OBJECTIVE:  05/26/22: PATIENT SURVEYS:   PFIQ-7 71  COGNITION: Overall cognitive status: Within functional limits for tasks assessed     SENSATION: Light touch: Appears intact Proprioception: Appears intact  FUNCTIONAL TESTS:  Squat: WNL Single leg stance: Rt 7 sec pain, Lt >10 sec no pain  GAIT: Comments: WNL  POSTURE:  elevated Lt iliac crest, decreased lumbar lordosis, posterior pelvic tilt, rounded shoulders, forward  head  LUMBARAROM/PROM:  A/PROM A/PROM  Eval(%)  Flexion 50, pain  Extension 50, pain  Right lateral flexion 75  Left lateral flexion 50, pain  Right rotation 75, pull on rt  Left rotation 75, pull on rt   (Blank rows = not tested)    LOWER EXTREMITY MMT:  MMT Right eval Left eval  Hip flexion 3/5, pain 3/5  Hip extension    Hip abduction 4/5 4/5  Hip adduction 3/5 3/5  Hip internal rotation 3/5 3/5  Hip external rotation 3/5 3/5  Knee flexion    Knee extension    Ankle dorsiflexion    Ankle plantarflexion    Ankle inversion    Ankle eversion     PALPATION:   General  tenderness throughout Rt lumbar paraspinals, abdominals, buttocks. Tenderness along spine from T10-coccyx                External Perineal Exam NA                             Internal Pelvic Floor NA  Patient confirms identification and approves PT to assess internal pelvic floor and treatment No  PELVIC MMT:   MMT eval  Vaginal   Internal Anal Sphincter   External Anal Sphincter   Puborectalis   Diastasis Recti   (Blank rows = not tested)        TONE: NA  PROLAPSE: NA  TODAY'S TREATMENT:                                                                                                                              DATE: 05/26/22  EVAL  Exercises: Stretches/mobility: Lower trunk rotation 2 x 10 Bent knee fall out 10x bil Open books 5x bil Child's pose 10 breaths Cat cow 2 x 10 Self-care: Education on possible benefit from pelvic floor exam Relationship of pelvic floor/deep core  and impact on low back/pelvic pain    PATIENT EDUCATION:  Education details: see above self-care Person educated: Patient Education method: Explanation, Demonstration, Tactile cues, Verbal cues, and Handouts Education comprehension: verbalized understanding  HOME EXERCISE PROGRAM: [id number]  ASSESSMENT:  CLINICAL IMPRESSION: Patient is a 21 y.o. female who was seen today for physical therapy  evaluation and treatment for pelvic and low back pain since delivery of son 09/10/20 and secondary c/o urinary dysfunction. Exam findings notable for decreased and painful lumbar A/ROM, decreased bil hip strength, tenderness in Rt lower quadrant and throughout lumbar/hip musculature, and abnormal posture. Signs and symptoms are most consistent with core weakness and instability with associated muscle spasm; we discussed pelvic floor internal examination if she does not seeing improvements. Initial treatment consisted of gentle mobility exercises and stretches; we discussed that exercise should be performed to help reduce pain. She will benefit from skilled PT intervention in order to improve pelvic and low back pain, increase functional ability, decrease urinary dysfunction, and improve QOL.   OBJECTIVE IMPAIRMENTS: decreased activity tolerance, decreased coordination, decreased endurance, decreased mobility, decreased strength, increased fascial restrictions, increased muscle spasms, impaired flexibility, impaired tone, postural dysfunction, and pain.   ACTIVITY LIMITATIONS: lifting, bending, sitting, standing, continence, and locomotion level  PARTICIPATION LIMITATIONS: community activity  PERSONAL FACTORS: 1 comorbidity: G1P1  are also affecting patient's functional outcome.   REHAB POTENTIAL: Good  CLINICAL DECISION MAKING: Stable/uncomplicated  EVALUATION COMPLEXITY: Low   GOALS: Goals reviewed with patient? Yes  SHORT TERM GOALS: Target date: 06/23/2022  Pt will be independent with HEP.   Baseline: Goal status: INITIAL  2.  Pt will be independent with the knack, urge suppression technique, and double voiding in order to improve bladder habits and decrease urinary incontinence.   Baseline:  Goal status: INITIAL  3.  Pt will increase lumbar A/ROM by 25%. Baseline:  Goal status: INITIAL    LONG TERM GOALS: Target date: 08/18/2022   Pt will be independent with advanced HEP.    Baseline:  Goal status: INITIAL  2.  Pt will demonstrate normal pelvic floor muscle tone and A/ROM, able to achieve 4/5 strength with contractions and 10 sec endurance, in order to provide appropriate lumbopelvic support in functional activities.   Baseline:  Goal status: INITIAL  3.  Pt will improve PFIQ score by 50% to demonstrate increase in QOL.  Baseline: 71 Goal status: INITIAL  4.  Pt will report no greater pain than 5/10 and report ability to sit, stand, and walk without difficulty.  Baseline:  Goal status: INITIAL  5.  Pt will demonstrate increase in all impaired hip strength by 1 muscle grades in order to demonstrate improved lumbopelvic support and increase functional ability.   Baseline:  Goal status: INITIAL  6.  Pt will report no episodes of urinary incontinence in order to improve confidence in community activities and personal hygiene.   Baseline:  Goal status: INITIAL  PLAN:  PT FREQUENCY: 1-2x/week  PT DURATION: 12 weeks  PLANNED INTERVENTIONS: Therapeutic exercises, Therapeutic activity, Neuromuscular re-education, Balance training, Gait training, Patient/Family education, Self Care, Joint mobilization, Dry Needling, Biofeedback, and Manual therapy  PLAN FOR NEXT SESSION: Double-voiding, diaphragmatic breathing, progress stretches, begin gentle strengthening.    Heather Roberts, PT, DPT10/29/239:01 PM  PHYSICAL THERAPY DISCHARGE SUMMARY  Visits from Start of Care: 1  Current functional level related to goals / functional outcomes: Not met   Remaining deficits: See above   Education / Equipment: HEP   Patient agrees to discharge.  Patient goals were not met. Patient is being discharged due to not returning since the last visit.  Heather Roberts, PT, DPT11/29/234:34 PM

## 2022-05-26 ENCOUNTER — Ambulatory Visit: Payer: Medicaid Other | Attending: Advanced Practice Midwife

## 2022-05-26 ENCOUNTER — Other Ambulatory Visit: Payer: Self-pay

## 2022-05-26 DIAGNOSIS — M6281 Muscle weakness (generalized): Secondary | ICD-10-CM

## 2022-05-26 DIAGNOSIS — R293 Abnormal posture: Secondary | ICD-10-CM

## 2022-05-26 DIAGNOSIS — R279 Unspecified lack of coordination: Secondary | ICD-10-CM

## 2022-05-26 DIAGNOSIS — M62838 Other muscle spasm: Secondary | ICD-10-CM

## 2022-05-26 DIAGNOSIS — M545 Low back pain, unspecified: Secondary | ICD-10-CM | POA: Diagnosis not present

## 2022-06-11 ENCOUNTER — Ambulatory Visit: Payer: Medicaid Other

## 2022-06-11 NOTE — Therapy (Incomplete)
OUTPATIENT PHYSICAL THERAPY TREATMENT NOTE   Patient Name: Lindsay [name] MRN: [id number] DOB:03/03/2001, 21 y.o., female Today's Date: 06/11/2022  PCP: NA REFERRING PROVIDER: Calvert CantorWeinhold, Samantha C, CNM  END OF SESSION:    Past Medical History:  Diagnosis Date   Attention deficit disorder with hyperactivity(314.01) 07/05/2013   Episodic tension type headache 11/01/2014   Intermittent asthma 11/21/2013   Triggered by seasonal allergies & occasional exercise induced.    Keloid of skin 04/19/2019   Medical history non-contributory    Migraine without aura 05/09/2013   Seasonal allergies    Past Surgical History:  Procedure Laterality Date   NO PAST SURGERIES     Patient Active Problem List   Diagnosis Date Noted   Chronic migraine without aura with status migrainosus, not intractable 09/28/2020   Supervision of normal first pregnancy 09/09/2020   Supervision of normal first teen pregnancy 02/16/2020   Maternal exposure to teratogen, first trimester 02/16/2020   Keloid of skin 04/19/2019   Episodic tension type headache 11/01/2014   Intermittent asthma 11/21/2013   Attention deficit hyperactivity disorder (ADHD) 07/05/2013   Migraine without aura 05/09/2013    REFERRING DIAG: M54.50 (ICD-10-CM) - Acute midline low back pain without sciatica  THERAPY DIAG:  No diagnosis found.  Rationale for Evaluation and Treatment Rehabilitation  PERTINENT HISTORY: Vaginal delivery 09/10/20, history of heavy/painful menstrual cycles   PRECAUTIONS: NA  SUBJECTIVE:                                                                                                                                                                                      SUBJECTIVE STATEMENT:  ***   PAIN:  Are you having pain? {OPRCPAIN:27236}   05/26/22 SUBJECTIVE STATEMENT: Vaginal delivery 09/10/21. She denies pain during pregnancy. She states that low back pain started after delivery when she got home.  Patient states that the pain is in bil low back and wraps around Rt side, sometimes on the Lt side.  Fluid intake: Yes: none to one glass     PAIN:  Are you having pain? Yes NPRS scale: 10/10, currently 6/10, 5-10 minutes Pain location: Deep, Right, Left, and low back/pelvis/abdomen   Pain type: aching, burning, dull, and sharp Pain description: intermittent and constant    Aggravating factors: being more active, sitting after a lot of activity, lying down too long Relieving factors: sometimes lying down, sometimes heat   PRECAUTIONS: None   WEIGHT BEARING RESTRICTIONS: No   FALLS:  Has patient fallen in last 6 months? No   LIVING ENVIRONMENT: Lives with: lives with their family Lives in: House/apartment   OCCUPATION: does not work outside the home   PLOF:  Independent   PATIENT GOALS: decrease pain, able to take care of son without difficulty   PERTINENT HISTORY:  Vaginal delivery 09/10/20, history of heavy/painful menstrual cycles  Sexual abuse: No   BOWEL MOVEMENT: Pain with bowel movement: No Type of bowel movement:Frequency every other day and Strain Yes Fully empty rectum: Yes: most of the time Leakage: No Pads: No Fiber supplement: No   URINATION: Pain with urination: No Fully empty bladder: Yes: - Stream: Strong Urgency: No Frequency: every 1-2 hours Leakage:  random, post-void dribbling Pads: No, uses tissue   INTERCOURSE: Pain with intercourse:  None Ability to have vaginal penetration:  Yes: - Climax: yes Marinoff Scale: 0/3   PREGNANCY: Vaginal deliveries 1 Tearing Yes: grade 2 C-section deliveries 0 Currently pregnant No   PROLAPSE:             No report of heaviness   OBJECTIVE:  05/26/22: PATIENT SURVEYS:    PFIQ-7 71   COGNITION: Overall cognitive status: Within functional limits for tasks assessed                          SENSATION: Light touch: Appears intact Proprioception: Appears intact   FUNCTIONAL TESTS:  Squat:  WNL Single leg stance: Rt 7 sec pain, Lt >10 sec no pain   GAIT: Comments: WNL   POSTURE:  elevated Lt iliac crest, decreased lumbar lordosis, posterior pelvic tilt, rounded shoulders, forward head   LUMBARAROM/PROM:   A/PROM A/PROM  Eval(%)  Flexion 50, pain  Extension 50, pain  Right lateral flexion 75  Left lateral flexion 50, pain  Right rotation 75, pull on rt  Left rotation 75, pull on rt   (Blank rows = not tested)       LOWER EXTREMITY MMT:   MMT Right eval Left eval  Hip flexion 3/5, pain 3/5  Hip extension      Hip abduction 4/5 4/5  Hip adduction 3/5 3/5  Hip internal rotation 3/5 3/5  Hip external rotation 3/5 3/5  Knee flexion      Knee extension      Ankle dorsiflexion      Ankle plantarflexion      Ankle inversion      Ankle eversion        PALPATION:   General  tenderness throughout Rt lumbar paraspinals, abdominals, buttocks. Tenderness along spine from T10-coccyx                 External Perineal Exam NA                             Internal Pelvic Floor NA   Patient confirms identification and approves PT to assess internal pelvic floor and treatment No   PELVIC MMT:   MMT eval  Vaginal    Internal Anal Sphincter    External Anal Sphincter    Puborectalis    Diastasis Recti    (Blank rows = not tested)         TONE: NA   PROLAPSE: NA   TODAY'S TREATMENT 06/11/22: Manual: Soft tissue mobilization: Scar tissue mobilization: Myofascial release: Spinal mobilization: Internal pelvic floor techniques: Dry needling: Neuromuscular re-education: Core retraining:  Core facilitation: Form correction: Pelvic floor contraction training: Down training: Exercises: Stretches/mobility: Strengthening: Therapeutic activities: Functional strengthening activities: Self-care:  DATE: 05/26/22  EVAL   Exercises: Stretches/mobility: Lower trunk rotation 2 x 10 Bent knee fall out 10x bil Open books 5x bil Child's pose 10 breaths Cat cow 2 x 10 Self-care: Education on possible benefit from pelvic floor exam Relationship of pelvic floor/deep core and impact on low back/pelvic pain       PATIENT EDUCATION:  Education details: see above self-care Person educated: Patient Education method: Explanation, Demonstration, Tactile cues, Verbal cues, and Handouts Education comprehension: verbalized understanding   HOME EXERCISE PROGRAM: [id number]   ASSESSMENT:   CLINICAL IMPRESSION: Patient is a 21 y.o. female who was seen today for physical therapy evaluation and treatment for pelvic and low back pain since delivery of son 09/10/20 and secondary c/o urinary dysfunction. Exam findings notable for decreased and painful lumbar A/ROM, decreased bil hip strength, tenderness in Rt lower quadrant and throughout lumbar/hip musculature, and abnormal posture. Signs and symptoms are most consistent with core weakness and instability with associated muscle spasm; we discussed pelvic floor internal examination if she does not seeing improvements. Initial treatment consisted of gentle mobility exercises and stretches; we discussed that exercise should be performed to help reduce pain. She will benefit from skilled PT intervention in order to improve pelvic and low back pain, increase functional ability, decrease urinary dysfunction, and improve QOL.    OBJECTIVE IMPAIRMENTS: decreased activity tolerance, decreased coordination, decreased endurance, decreased mobility, decreased strength, increased fascial restrictions, increased muscle spasms, impaired flexibility, impaired tone, postural dysfunction, and pain.    ACTIVITY LIMITATIONS: lifting, bending, sitting, standing, continence, and locomotion level   PARTICIPATION LIMITATIONS: community activity   PERSONAL FACTORS: 1 comorbidity: G1P1  are also  affecting patient's functional outcome.    REHAB POTENTIAL: Good   CLINICAL DECISION MAKING: Stable/uncomplicated   EVALUATION COMPLEXITY: Low     GOALS: Goals reviewed with patient? Yes   SHORT TERM GOALS: Target date: 06/23/2022   Pt will be independent with HEP.    Baseline: Goal status: INITIAL   2.  Pt will be independent with the knack, urge suppression technique, and double voiding in order to improve bladder habits and decrease urinary incontinence.    Baseline:  Goal status: INITIAL   3.  Pt will increase lumbar A/ROM by 25%. Baseline:  Goal status: INITIAL       LONG TERM GOALS: Target date: 08/18/2022    Pt will be independent with advanced HEP.    Baseline:  Goal status: INITIAL   2.  Pt will demonstrate normal pelvic floor muscle tone and A/ROM, able to achieve 4/5 strength with contractions and 10 sec endurance, in order to provide appropriate lumbopelvic support in functional activities.    Baseline:  Goal status: INITIAL   3.  Pt will improve PFIQ score by 50% to demonstrate increase in QOL.  Baseline: 71 Goal status: INITIAL   4.  Pt will report no greater pain than 5/10 and report ability to sit, stand, and walk without difficulty.  Baseline:  Goal status: INITIAL   5.  Pt will demonstrate increase in all impaired hip strength by 1 muscle grades in order to demonstrate improved lumbopelvic support and increase functional ability.    Baseline:  Goal status: INITIAL   6.  Pt will report no episodes of urinary incontinence in order to improve confidence in community activities and personal hygiene.    Baseline:  Goal status: INITIAL   PLAN:   PT FREQUENCY: 1-2x/week   PT DURATION: 12 weeks   PLANNED INTERVENTIONS: Therapeutic  exercises, Therapeutic activity, Neuromuscular re-education, Balance training, Gait training, Patient/Family education, Self Care, Joint mobilization, Dry Needling, Biofeedback, and Manual therapy   PLAN FOR NEXT  SESSION: Double-voiding, diaphragmatic breathing, progress stretches, begin gentle strengthening.    Marisue Ivan, PT 06/11/2022, 10:31 AM

## 2022-06-12 ENCOUNTER — Ambulatory Visit: Payer: Medicaid Other

## 2022-06-23 ENCOUNTER — Ambulatory Visit: Payer: Medicaid Other

## 2022-06-23 NOTE — Therapy (Incomplete)
OUTPATIENT PHYSICAL THERAPY TREATMENT NOTE   Patient Name: Lindsay [name] MRN: [id number] DOB:[birth year], 21 y.o., female Today's Date: 06/23/2022  PCP: NA REFERRING PROVIDER: Calvert Cantor, CNM  END OF SESSION:    Past Medical History:  Diagnosis Date   Attention deficit disorder with hyperactivity(314.01) 07/05/2013   Episodic tension type headache 11/01/2014   Intermittent asthma 11/21/2013   Triggered by seasonal allergies & occasional exercise induced.    Keloid of skin 04/19/2019   Medical history non-contributory    Migraine without aura 05/09/2013   Seasonal allergies    Past Surgical History:  Procedure Laterality Date   NO PAST SURGERIES     Patient Active Problem List   Diagnosis Date Noted   Chronic migraine without aura with status migrainosus, not intractable 09/28/2020   Supervision of normal first pregnancy 09/09/2020   Supervision of normal first teen pregnancy 02/16/2020   Maternal exposure to teratogen, first trimester 02/16/2020   Keloid of skin 04/19/2019   Episodic tension type headache 11/01/2014   Intermittent asthma 11/21/2013   Attention deficit hyperactivity disorder (ADHD) 07/05/2013   Migraine without aura 05/09/2013    REFERRING DIAG: M54.50 (ICD-10-CM) - Acute midline low back pain without sciatica  THERAPY DIAG:  No diagnosis found.  Rationale for Evaluation and Treatment Rehabilitation  PERTINENT HISTORY: Vaginal delivery 09/10/20, history of heavy/painful menstrual cycles   PRECAUTIONS: NA  SUBJECTIVE:                                                                                                                                                                                      SUBJECTIVE STATEMENT:  ***   PAIN:  Are you having pain? {OPRCPAIN:27236}   05/26/22 SUBJECTIVE STATEMENT: Vaginal delivery 09/10/21. She denies pain during pregnancy. She states that low back pain started after delivery when she got home.  Patient states that the pain is in bil low back and wraps around Rt side, sometimes on the Lt side.  Fluid intake: Yes: none to one glass     PAIN:  Are you having pain? Yes NPRS scale: 10/10, currently 6/10, 5-10 minutes Pain location: Deep, Right, Left, and low back/pelvis/abdomen   Pain type: aching, burning, dull, and sharp Pain description: intermittent and constant    Aggravating factors: being more active, sitting after a lot of activity, lying down too long Relieving factors: sometimes lying down, sometimes heat   PRECAUTIONS: None   WEIGHT BEARING RESTRICTIONS: No   FALLS:  Has patient fallen in last 6 months? No   LIVING ENVIRONMENT: Lives with: lives with their family Lives in: House/apartment   OCCUPATION: does not work outside the home   PLOF:  Independent   PATIENT GOALS: decrease pain, able to take care of son without difficulty   PERTINENT HISTORY:  Vaginal delivery 09/10/20, history of heavy/painful menstrual cycles  Sexual abuse: No   BOWEL MOVEMENT: Pain with bowel movement: No Type of bowel movement:Frequency every other day and Strain Yes Fully empty rectum: Yes: most of the time Leakage: No Pads: No Fiber supplement: No   URINATION: Pain with urination: No Fully empty bladder: Yes: - Stream: Strong Urgency: No Frequency: every 1-2 hours Leakage:  random, post-void dribbling Pads: No, uses tissue   INTERCOURSE: Pain with intercourse:  None Ability to have vaginal penetration:  Yes: - Climax: yes Marinoff Scale: 0/3   PREGNANCY: Vaginal deliveries 1 Tearing Yes: grade 2 C-section deliveries 0 Currently pregnant No   PROLAPSE:             No report of heaviness   OBJECTIVE:  05/26/22: PATIENT SURVEYS:    PFIQ-7 71   COGNITION: Overall cognitive status: Within functional limits for tasks assessed                          SENSATION: Light touch: Appears intact Proprioception: Appears intact   FUNCTIONAL TESTS:  Squat:  WNL Single leg stance: Rt 7 sec pain, Lt >10 sec no pain   GAIT: Comments: WNL   POSTURE:  elevated Lt iliac crest, decreased lumbar lordosis, posterior pelvic tilt, rounded shoulders, forward head   LUMBARAROM/PROM:   A/PROM A/PROM  Eval(%)  Flexion 50, pain  Extension 50, pain  Right lateral flexion 75  Left lateral flexion 50, pain  Right rotation 75, pull on rt  Left rotation 75, pull on rt   (Blank rows = not tested)       LOWER EXTREMITY MMT:   MMT Right eval Left eval  Hip flexion 3/5, pain 3/5  Hip extension      Hip abduction 4/5 4/5  Hip adduction 3/5 3/5  Hip internal rotation 3/5 3/5  Hip external rotation 3/5 3/5  Knee flexion      Knee extension      Ankle dorsiflexion      Ankle plantarflexion      Ankle inversion      Ankle eversion        PALPATION:   General  tenderness throughout Rt lumbar paraspinals, abdominals, buttocks. Tenderness along spine from T10-coccyx                 External Perineal Exam NA                             Internal Pelvic Floor NA   Patient confirms identification and approves PT to assess internal pelvic floor and treatment No   PELVIC MMT:   MMT eval  Vaginal    Internal Anal Sphincter    External Anal Sphincter    Puborectalis    Diastasis Recti    (Blank rows = not tested)         TONE: NA   PROLAPSE: NA   TODAY'S TREATMENT 06/23/22: Manual: Soft tissue mobilization: Scar tissue mobilization: Myofascial release: Spinal mobilization: Internal pelvic floor techniques: Dry needling: Neuromuscular re-education: Core retraining:  Core facilitation: Form correction: Pelvic floor contraction training: Down training: Exercises: Stretches/mobility: Strengthening: Therapeutic activities: Functional strengthening activities: Self-care:  DATE: 05/26/22  EVAL   Exercises: Stretches/mobility: Lower trunk rotation 2 x 10 Bent knee fall out 10x bil Open books 5x bil Child's pose 10 breaths Cat cow 2 x 10 Self-care: Education on possible benefit from pelvic floor exam Relationship of pelvic floor/deep core and impact on low back/pelvic pain       PATIENT EDUCATION:  Education details: see above self-care Person educated: Patient Education method: Explanation, Demonstration, Tactile cues, Verbal cues, and Handouts Education comprehension: verbalized understanding   HOME EXERCISE PROGRAM: [id number]   ASSESSMENT:   CLINICAL IMPRESSION: Patient is a 21 y.o. female who was seen today for physical therapy evaluation and treatment for pelvic and low back pain since delivery of son 09/10/20 and secondary c/o urinary dysfunction. Exam findings notable for decreased and painful lumbar A/ROM, decreased bil hip strength, tenderness in Rt lower quadrant and throughout lumbar/hip musculature, and abnormal posture. Signs and symptoms are most consistent with core weakness and instability with associated muscle spasm; we discussed pelvic floor internal examination if she does not seeing improvements. Initial treatment consisted of gentle mobility exercises and stretches; we discussed that exercise should be performed to help reduce pain. She will benefit from skilled PT intervention in order to improve pelvic and low back pain, increase functional ability, decrease urinary dysfunction, and improve QOL.    OBJECTIVE IMPAIRMENTS: decreased activity tolerance, decreased coordination, decreased endurance, decreased mobility, decreased strength, increased fascial restrictions, increased muscle spasms, impaired flexibility, impaired tone, postural dysfunction, and pain.    ACTIVITY LIMITATIONS: lifting, bending, sitting, standing, continence, and locomotion level   PARTICIPATION LIMITATIONS: community activity   PERSONAL FACTORS: 1 comorbidity: G1P1  are also  affecting patient's functional outcome.    REHAB POTENTIAL: Good   CLINICAL DECISION MAKING: Stable/uncomplicated   EVALUATION COMPLEXITY: Low     GOALS: Goals reviewed with patient? Yes   SHORT TERM GOALS: Target date: 06/23/2022   Pt will be independent with HEP.    Baseline: Goal status: INITIAL   2.  Pt will be independent with the knack, urge suppression technique, and double voiding in order to improve bladder habits and decrease urinary incontinence.    Baseline:  Goal status: INITIAL   3.  Pt will increase lumbar A/ROM by 25%. Baseline:  Goal status: INITIAL       LONG TERM GOALS: Target date: 08/18/2022    Pt will be independent with advanced HEP.    Baseline:  Goal status: INITIAL   2.  Pt will demonstrate normal pelvic floor muscle tone and A/ROM, able to achieve 4/5 strength with contractions and 10 sec endurance, in order to provide appropriate lumbopelvic support in functional activities.    Baseline:  Goal status: INITIAL   3.  Pt will improve PFIQ score by 50% to demonstrate increase in QOL.  Baseline: 71 Goal status: INITIAL   4.  Pt will report no greater pain than 5/10 and report ability to sit, stand, and walk without difficulty.  Baseline:  Goal status: INITIAL   5.  Pt will demonstrate increase in all impaired hip strength by 1 muscle grades in order to demonstrate improved lumbopelvic support and increase functional ability.    Baseline:  Goal status: INITIAL   6.  Pt will report no episodes of urinary incontinence in order to improve confidence in community activities and personal hygiene.    Baseline:  Goal status: INITIAL   PLAN:   PT FREQUENCY: 1-2x/week   PT DURATION: 12 weeks   PLANNED INTERVENTIONS: Therapeutic  exercises, Therapeutic activity, Neuromuscular re-education, Balance training, Gait training, Patient/Family education, Self Care, Joint mobilization, Dry Needling, Biofeedback, and Manual therapy   PLAN FOR NEXT  SESSION: Double-voiding, diaphragmatic breathing, progress stretches, begin gentle strengthening.    Marisue Ivan, PT 06/23/2022, 1:35 PM

## 2022-06-25 ENCOUNTER — Ambulatory Visit: Payer: Medicaid Other | Attending: Advanced Practice Midwife

## 2022-06-25 DIAGNOSIS — M545 Low back pain, unspecified: Secondary | ICD-10-CM | POA: Insufficient documentation

## 2022-07-10 ENCOUNTER — Telehealth: Payer: Self-pay | Admitting: Psychiatry

## 2022-07-10 NOTE — Telephone Encounter (Signed)
Attempted to call the patient back. There was no answer and no vm set up to leave a message.  **If the pt calls back, please get more information. The last office visit was June 2023. There is nothing else documented indicating that the patient has been out of work or had any migraines. We can't write letters/notes for patients being out of work unless we have actually seen the patient. The patient would need to discuss with her work/HR about having FMLA completed to help cover her on days when she has to miss work related to migraine.

## 2022-07-10 NOTE — Telephone Encounter (Signed)
Pt is calling. Said she wants to talk to nurse about getting three doctor's note for miss work. Pt is requesting call back from nurse

## 2022-07-15 NOTE — Progress Notes (Signed)
   CC:  headaches  Follow-up Visit  Last visit: 01/08/22  Brief HPI: 21 year old female with a history of asthma and ADHD who follows in clinic for migraines.   At her last visit she was started on Qulipta 60 mg QHS and Maxalt 10 mg PRN. She was continued on gabapentin 300 mg QHS.  Interval History: She has not noticed much improvement since starting Qulipta. Did not start it until a couple of months ago and thinks it is too soon to tell if it is working. She continues to take gabapentin 300 mg QHS which helps somewhat. Has not yet tried Maxalt for rescue.  Migraine days per month: 15 Headache free days per month: 15  Current Headache Regimen: Preventative: Qulipta 60 mg daily, gabapentin 300 mg QHS Abortive: Maxalt 10 mg PRN   Prior Therapies                                  Prevention: Verapamil 80 mg BID Emgality 120 mg monthly Gabapentin 300 mg QHS Depakote Cyproheptadine Topamax Amitriptyline Zoloft  Rescue: Imitrex Maxalt 10 mg PRN Ubrelvy - lack of efficacy Promethazine  Physical Exam:   Vital Signs: BP 102/64   Pulse 77   Ht 5\' 5"  (1.651 m)   Wt 204 lb (92.5 kg)   BMI 33.95 kg/m  GENERAL:  well appearing, in no acute distress, alert  SKIN:  Color, texture, turgor normal. No rashes or lesions HEAD:  Normocephalic/atraumatic. RESP: normal respiratory effort MSK:  No gross joint deformities.   NEUROLOGICAL: Mental Status: Alert, oriented to person, place and time, Follows commands, and Speech fluent and appropriate. Cranial Nerves: PERRL, face symmetric, no dysarthria, hearing grossly intact Motor: moves all extremities equally Gait: normal-based.  IMPRESSION: 21 year old female with a history of asthma and ADHD who presents for follow up of migraines. She has not noticed much of a difference since starting Qulipta, but only started it a couple of months ago and thinks it may be too soon to tell a difference. Will continue Qulipta for now and increase  gabapentin to 600 mg QHS. Encouraged her to try Maxalt for rescue.  PLAN: -Prevention: Continue Qulipta 60 mg daily. Increase gabapentin to 600 mg QHS -Rescue: Continue Maxalt 10 mg PRN -next steps: consider vyepti, botox, Aimovig/Ajovy    Follow-up: 6 months  I spent a total of 30 minutes on the date of the service. Headache education was done. Discussed medication side effects, adverse reactions and drug interactions.   Ocie Doyne, MD 07/16/22 2:19 PM

## 2022-07-16 ENCOUNTER — Ambulatory Visit: Payer: Medicaid Other | Admitting: Psychiatry

## 2022-07-16 ENCOUNTER — Encounter: Payer: Self-pay | Admitting: Psychiatry

## 2022-07-16 DIAGNOSIS — G43019 Migraine without aura, intractable, without status migrainosus: Secondary | ICD-10-CM | POA: Diagnosis not present

## 2022-07-16 MED ORDER — GABAPENTIN 300 MG PO CAPS: 600.0000 mg | ORAL_CAPSULE | Freq: Every day | ORAL | 5 refills | Status: DC

## 2022-08-18 ENCOUNTER — Ambulatory Visit: Payer: Medicaid Other

## 2022-09-17 ENCOUNTER — Other Ambulatory Visit: Payer: Self-pay | Admitting: Family Medicine

## 2022-09-17 DIAGNOSIS — Z30011 Encounter for initial prescription of contraceptive pills: Secondary | ICD-10-CM

## 2022-10-21 ENCOUNTER — Other Ambulatory Visit: Payer: Self-pay | Admitting: Family Medicine

## 2022-10-21 ENCOUNTER — Other Ambulatory Visit: Payer: Self-pay | Admitting: Psychiatry

## 2022-10-21 ENCOUNTER — Other Ambulatory Visit: Payer: Self-pay | Admitting: *Deleted

## 2022-10-21 DIAGNOSIS — Z30011 Encounter for initial prescription of contraceptive pills: Secondary | ICD-10-CM

## 2022-10-21 MED ORDER — NORGESTIMATE-ETH ESTRADIOL 0.25-35 MG-MCG PO TABS: 1.0000 | ORAL_TABLET | Freq: Every day | ORAL | 0 refills | Status: DC

## 2022-10-21 NOTE — Telephone Encounter (Signed)
Last seen on 07/16/22 Follow up scheduled on 03/18/23

## 2022-11-25 ENCOUNTER — Other Ambulatory Visit: Payer: Self-pay | Admitting: Family Medicine

## 2022-11-25 DIAGNOSIS — Z30011 Encounter for initial prescription of contraceptive pills: Secondary | ICD-10-CM

## 2022-12-23 ENCOUNTER — Other Ambulatory Visit: Payer: Self-pay | Admitting: *Deleted

## 2022-12-23 DIAGNOSIS — Z30011 Encounter for initial prescription of contraceptive pills: Secondary | ICD-10-CM

## 2022-12-23 MED ORDER — NORGESTIMATE-ETH ESTRADIOL 0.25-35 MG-MCG PO TABS: 1.0000 | ORAL_TABLET | Freq: Every day | ORAL | 1 refills | Status: DC

## 2023-02-08 ENCOUNTER — Other Ambulatory Visit: Payer: Self-pay | Admitting: Psychiatry

## 2023-02-08 DIAGNOSIS — G43019 Migraine without aura, intractable, without status migrainosus: Secondary | ICD-10-CM

## 2023-02-09 MED ORDER — GABAPENTIN 300 MG PO CAPS: 600.0000 mg | ORAL_CAPSULE | Freq: Every day | ORAL | 1 refills | Status: DC

## 2023-02-09 NOTE — Telephone Encounter (Signed)
Last seen on 07/16/22 per note " Increase gabapentin to 600 mg QHS -Rescue: Continue Maxalt 10 mg PRN Follow up scheduled on 03/18/23

## 2023-02-10 ENCOUNTER — Encounter (HOSPITAL_BASED_OUTPATIENT_CLINIC_OR_DEPARTMENT_OTHER): Payer: Self-pay

## 2023-02-10 ENCOUNTER — Other Ambulatory Visit: Payer: Self-pay

## 2023-02-10 ENCOUNTER — Emergency Department (HOSPITAL_BASED_OUTPATIENT_CLINIC_OR_DEPARTMENT_OTHER)
Admission: EM | Admit: 2023-02-10 | Discharge: 2023-02-10 | Disposition: A | Discharge status: Home / Self Care | Payer: Medicaid Other | Attending: Emergency Medicine | Admitting: Emergency Medicine

## 2023-02-10 DIAGNOSIS — G43909 Migraine, unspecified, not intractable, without status migrainosus: Secondary | ICD-10-CM | POA: Diagnosis not present

## 2023-02-10 DIAGNOSIS — R1084 Generalized abdominal pain: Secondary | ICD-10-CM

## 2023-02-10 DIAGNOSIS — R519 Headache, unspecified: Secondary | ICD-10-CM | POA: Diagnosis present

## 2023-02-10 DIAGNOSIS — J45909 Unspecified asthma, uncomplicated: Secondary | ICD-10-CM | POA: Diagnosis not present

## 2023-02-10 LAB — COMPREHENSIVE METABOLIC PANEL
ALT: 14 U/L (ref 0–44)
AST: 16 U/L (ref 15–41)
Albumin: 4.4 g/dL (ref 3.5–5.0)
Alkaline Phosphatase: 65 U/L (ref 38–126)
Anion gap: 8 (ref 5–15)
BUN: 9 mg/dL (ref 6–20)
CO2: 26 mmol/L (ref 22–32)
Calcium: 9.9 mg/dL (ref 8.9–10.3)
Chloride: 103 mmol/L (ref 98–111)
Creatinine, Ser: 0.86 mg/dL (ref 0.44–1.00)
GFR, Estimated: >60 mL/min (ref >60)
Glucose, Bld: 93 mg/dL (ref 70–99)
Potassium: 3.8 mmol/L (ref 3.5–5.1)
Sodium: 137 mmol/L (ref 135–145)
Total Bilirubin: 0.8 mg/dL (ref 0.3–1.2)
Total Protein: 8.1 g/dL (ref 6.5–8.1)

## 2023-02-10 LAB — CBC
HCT: 42.7 % (ref 36.0–46.0)
Hemoglobin: 14.5 g/dL (ref 12.0–15.0)
MCH: 30.9 pg (ref 26.0–34.0)
MCHC: 34 g/dL (ref 30.0–36.0)
MCV: 90.9 fL (ref 80.0–100.0)
Platelets: 317 10*3/uL (ref 150–400)
RBC: 4.7 MIL/uL (ref 3.87–5.11)
RDW: 12.5 % (ref 11.5–15.5)
WBC: 7.5 10*3/uL (ref 4.0–10.5)
nRBC: 0 % (ref 0.0–0.2)

## 2023-02-10 LAB — URINALYSIS, ROUTINE W REFLEX MICROSCOPIC
Bacteria, UA: NONE SEEN
Bilirubin Urine: NEGATIVE
Glucose, UA: NEGATIVE mg/dL
Hgb urine dipstick: NEGATIVE
Ketones, ur: NEGATIVE mg/dL
Nitrite: NEGATIVE
Protein, ur: NEGATIVE mg/dL
Specific Gravity, Urine: 1.013 (ref 1.005–1.030)
pH: 8 (ref 5.0–8.0)

## 2023-02-10 LAB — HCG, SERUM, QUALITATIVE: Preg, Serum: NEGATIVE

## 2023-02-10 LAB — LIPASE, BLOOD: Lipase: <10 U/L — ABNORMAL LOW (ref 11–51)

## 2023-02-10 MED ORDER — DEXAMETHASONE SODIUM PHOSPHATE 10 MG/ML IJ SOLN
10.0000 mg | Freq: Once | INTRAMUSCULAR | Status: AC
  Administered 2023-02-10: 10 mg via INTRAVENOUS
  Filled 2023-02-10: qty 1

## 2023-02-10 MED ORDER — METOCLOPRAMIDE HCL 5 MG/ML IJ SOLN
10.0000 mg | Freq: Once | INTRAMUSCULAR | Status: AC
  Administered 2023-02-10: 10 mg via INTRAVENOUS
  Filled 2023-02-10: qty 2

## 2023-02-10 MED ORDER — ACETAMINOPHEN 500 MG PO TABS
1000.0000 mg | ORAL_TABLET | Freq: Once | ORAL | Status: AC
  Administered 2023-02-10: 1000 mg via ORAL
  Filled 2023-02-10: qty 2

## 2023-02-10 MED ORDER — SODIUM CHLORIDE 0.9 % IV BOLUS
1000.0000 mL | Freq: Once | INTRAVENOUS | Status: AC
  Administered 2023-02-10: 1000 mL via INTRAVENOUS

## 2023-02-10 MED ORDER — KETOROLAC TROMETHAMINE 15 MG/ML IJ SOLN
15.0000 mg | Freq: Once | INTRAMUSCULAR | Status: AC
  Administered 2023-02-10: 15 mg via INTRAVENOUS
  Filled 2023-02-10: qty 1

## 2023-02-10 MED ORDER — DIPHENHYDRAMINE HCL 50 MG/ML IJ SOLN
25.0000 mg | Freq: Once | INTRAMUSCULAR | Status: AC
  Administered 2023-02-10: 25 mg via INTRAVENOUS
  Filled 2023-02-10: qty 1

## 2023-02-10 NOTE — ED Notes (Signed)
Pt aware of the need for a urine... Unable to currently provide a sample... 

## 2023-02-10 NOTE — ED Notes (Signed)
Discharge paperwork given and verbally understood. 

## 2023-02-10 NOTE — Discharge Instructions (Addendum)
We treated you for a migraine headache.  Your labs are reassuring.  Your urine is still pending.  Follow-up on this on MyChart.  Follow-up with your outpatient neurology provider for further control of your migraines.  I also recommend following up with your PCP within the next week for reevaluation.  If you do not currently have a PCP, I provided 2 clinics that you may follow-up with.  For new or worsening condition, return to the ED for further evaluation.

## 2023-02-10 NOTE — ED Triage Notes (Signed)
Patient here POV from Home.  Endorses Lower ABD Pain and Nausea intermittently since last PM. No Known fevers, Emesis or Diarrhea.  NAD Noted during triage. A&Ox4. GCS 15. Ambulatory.

## 2023-02-10 NOTE — ED Notes (Signed)
Pt aware of the need for a urine... Currently unable to provide a sample.Lindsay KitchenMarland [name]

## 2023-02-10 NOTE — ED Provider Notes (Signed)
[place] EMERGENCY DEPARTMENT AT [facility] Provider Note   CSN: [id number] Arrival date & time: 02/10/23  1157     History  Chief Complaint  Patient presents with   Nausea    Lea Baine is a 22 y.o. female with past medical history of migraine headaches, asthma, ADHD who presents to the ED complaining of headache and abdominal pain that started yesterday.  States that she has a bilateral frontal headache with associated dizziness and lower abdominal pain.  Denies chance of pregnancy.  Associated nausea but no vomiting, visual changes, neck pain, diarrhea, fever, or other acute complaints.  Notes that she is follow-up with neurology soon for better control of her headaches.      Home Medications Prior to Admission medications   Medication Sig Start Date End Date Taking? Authorizing Provider  acetaminophen (TYLENOL) 500 MG tablet Take 1,000 mg by mouth every 6 (six) hours as needed for mild pain or headache.    [provider]  Atogepant (QULIPTA) 60 MG TABS TAKE 1 TABLET BY MOUTH DAILY 10/21/22   Ocie Doyne, MD  gabapentin (NEURONTIN) 300 MG capsule Take 2 capsules (600 mg total) by mouth at bedtime. 02/09/23   Ocie Doyne, MD  ketorolac (TORADOL) 10 MG tablet Take 1 tablet (10 mg total) by mouth every 6 (six) hours as needed. 03/06/22   Calvert Cantor, CNM  loratadine (CLARITIN) 10 MG tablet Take 10 mg by mouth daily. 07/12/22   [provider]  norgestimate-ethinyl estradiol (ESTARYLLA) 0.25-35 MG-MCG tablet Take 1 tablet by mouth daily. 12/23/22   Federico Flake, MD  rizatriptan (MAXALT-MLT) 10 MG disintegrating tablet DISSOLVE ONE TABLET BY MOUTH AS NEEDED FOR MIGRAINE. MAY REPEAT IN 2 HOURS IF NEEDED 02/09/23   Ocie Doyne, MD  sertraline (ZOLOFT) 50 MG tablet Take 1 tablet per day 07/25/21   Elveria Rising, NP      Allergies    Valproic acid    Review of Systems   Review of Systems  All other systems reviewed and  are negative.   Physical Exam Updated Vital Signs BP (!) 100/52   Pulse 79   Temp 98.8 F (37.1 C) (Oral)   Resp 13   Ht 5\' 5"  (1.651 m)   Wt 90.7 kg   SpO2 100%   BMI 33.28 kg/m  Physical Exam Vitals and nursing note reviewed.  Constitutional:      General: She is not in acute distress.    Appearance: Normal appearance. She is not ill-appearing or toxic-appearing.  HENT:     Head: Normocephalic and atraumatic.     Mouth/Throat:     Mouth: Mucous membranes are moist.  Eyes:     Extraocular Movements: Extraocular movements intact.     Conjunctiva/sclera: Conjunctivae normal.     Pupils: Pupils are equal, round, and reactive to light.  Cardiovascular:     Rate and Rhythm: Normal rate and regular rhythm.     Heart sounds: No murmur heard. Pulmonary:     Effort: Pulmonary effort is normal. No respiratory distress.     Breath sounds: Normal breath sounds. No stridor. No wheezing, rhonchi or rales.  Abdominal:     General: Abdomen is flat. There is no distension.     Palpations: Abdomen is soft.     Tenderness: There is no abdominal tenderness. There is no guarding or rebound.  Musculoskeletal:        General: Normal range of motion.     Cervical back: Normal range  of motion and neck supple. No rigidity or tenderness.     Right lower leg: No edema.     Left lower leg: No edema.  Skin:    General: Skin is warm and dry.     Capillary Refill: Capillary refill takes less than 2 seconds.  Neurological:     General: No focal deficit present.     Mental Status: She is alert and oriented to person, place, and time.     GCS: GCS eye subscore is 4. GCS verbal subscore is 5. GCS motor subscore is 6.     Cranial Nerves: Cranial nerves 2-12 are intact. No cranial nerve deficit, dysarthria or facial asymmetry.     Sensory: Sensation is intact.     Motor: Motor function is intact. No weakness, tremor, atrophy, abnormal muscle tone or seizure activity.     Coordination: Coordination is  intact.  Psychiatric:        Behavior: Behavior normal.     ED Results / Procedures / Treatments   Labs (all labs ordered are listed, but only abnormal results are displayed) Labs Reviewed  LIPASE, BLOOD - Abnormal; Notable for the following components:      Result Value   Lipase <10 (*)    All other components within normal limits  COMPREHENSIVE METABOLIC PANEL  CBC  HCG, SERUM, QUALITATIVE  URINALYSIS, ROUTINE W REFLEX MICROSCOPIC    EKG EKG Interpretation Date/Time:  Tuesday February 10 2023 14:20:11 EDT Ventricular Rate:  70 PR Interval:  153 QRS Duration:  78 QT Interval:  396 QTC Calculation: 428 R Axis:   108  Text Interpretation: Right and left arm electrode reversal, interpretation assumes no reversal Sinus or ectopic atrial rhythm Borderline right axis deviation Borderline Q waves in lateral leads Otherwise no significant change Confirmed by Melene Plan [phone]) on 02/10/2023 2:26:16 PM  Radiology No results found.  Procedures Procedures    Medications Ordered in ED Medications  ketorolac (TORADOL) 15 MG/ML injection 15 mg (15 mg Intravenous Given 02/10/23 1401)  metoCLOPramide (REGLAN) injection 10 mg (10 mg Intravenous Given 02/10/23 1401)  dexamethasone (DECADRON) injection 10 mg (10 mg Intravenous Given 02/10/23 1401)  diphenhydrAMINE (BENADRYL) injection 25 mg (25 mg Intravenous Given 02/10/23 1401)  sodium chloride 0.9 % bolus 1,000 mL (0 mLs Intravenous Stopped 02/10/23 1550)  acetaminophen (TYLENOL) tablet 1,000 mg (1,000 mg Oral Given 02/10/23 1412)    ED Course/ Medical Decision Making/ A&P                             Medical Decision Making Amount and/or Complexity of Data Reviewed Labs: ordered. Decision-making details documented in ED Course. ECG/medicine tests: ordered. Decision-making details documented in ED Course.  Risk OTC drugs. Prescription drug management.   Medical Decision Making:   Keyarra Rendall is a 22 y.o. female who presented  to the ED today with headache detailed above.    Additional history discussed with patient's family/caregivers.  Patient's presentation is complicated by their history of migraines.  Complete initial physical exam performed, notably the patient was neurologically intact. Abdomen soft and nontender.    Reviewed and confirmed nursing documentation for past medical history, family history, social history.    Initial Assessment:   With the patient's presentation of headache/abdominal pain, differential diagnosis includes but is not limited to emergent considerations for headache include subarachnoid hemorrhage, meningitis, temporal arteritis, glaucoma, cerebral ischemia, carotid/vertebral dissection, intracranial tumor, venous sinus thrombosis, carbon monoxide poisoning,  acute or chronic subdural hemorrhage.  Other considerations include: migraine, cluster headache, hypertension, caffeine, alcohol, or drug withdrawal, pseudotumor cerebri, arteriovenous malformation, head injury, neurocysticercosis, post-lumbar puncture, preeclampsia in those assigned female at birth and in reproductive age, tension headache, viral vs acute bacterial sinusitis, cervical arthritis, refractive error causing strain, temporomandibular joint syndrome, depression, somatoform disorder (eg, somatization), trigeminal neuralgia, glossopharyngeal neuralgia; AAA, mesenteric ischemia, appendicitis, diverticulitis, DKA, gastritis, gastroenteritis, AMI, nephrolithiasis, pancreatitis, peritonitis, adrenal insufficiency,lead poisoning, iron toxicity, intestinal ischemia, constipation, UTI,SBO/LBO, splenic rupture, biliary disease, IBD, IBS, PUD, or hepatitis. This is most consistent with an acute complicated illness  Initial Plan:  Screening labs including CBC and Metabolic panel to evaluate for infectious or metabolic etiology of disease.  Urinalysis with reflex culture ordered to evaluate for UTI or relevant urologic/nephrologic pathology.   Lipase to assess for pancreatitis EKG to evaluate for cardiac pathology Symptomatic management Objective evaluation as below reviewed   Initial Study Results:   Laboratory  All laboratory results reviewed without evidence of clinically relevant pathology.      Final Assessment and Plan:   22 year old female presents to the ED complaining of headache and abdominal pain.  History of chronic migraine headaches though currently not well-controlled.  Is followed by neurology.  No focal tenderness on abdominal exam.  Neurologically intact.  Vital signs reassuring.  Workup initiated as above for further assessment.  On my initial assessment, patient denies chance of pregnancy but and noted she may be pregnant to nursing staff so there was a delay in treating headache.  Pregnancy test was negative so we proceeded with headache cocktail.  Labs unremarkable.  Patient with good pain control of both headache and abdominal pain following migraine cocktail.  Suspect that this was an acute on chronic exacerbation of her migraine headaches.  With abdominal pain, urinalysis was also ordered.  We were able to obtain a sample but patient stated that she would like to be discharged home prior to this resulting.  She stated that she will follow-up on this via MyChart.  No associated dysuria, hematuria.  Abdominal pain has resolved.  With this, will discharge patient home to follow-up outpatient with her PCP and neurologist.  Strict ED return precautions given, all questions answered, and stable for discharge.   Clinical Impression:  1. Migraine without status migrainosus, not intractable, unspecified migraine type   2. Generalized abdominal pain      Discharge           Final Clinical Impression(s) / ED Diagnoses Final diagnoses:  Migraine without status migrainosus, not intractable, unspecified migraine type  Generalized abdominal pain    Rx / DC Orders ED Discharge Orders     None          Tonette Lederer, PA-C 02/10/23 1637    Melene Plan, DO 02/11/23 [phone]

## 2023-03-18 ENCOUNTER — Ambulatory Visit: Payer: Medicaid Other | Admitting: Psychiatry

## 2023-03-18 ENCOUNTER — Encounter: Payer: Self-pay | Admitting: Psychiatry

## 2023-03-18 DIAGNOSIS — G43019 Migraine without aura, intractable, without status migrainosus: Secondary | ICD-10-CM | POA: Diagnosis not present

## 2023-03-18 MED ORDER — GABAPENTIN 300 MG PO CAPS
ORAL_CAPSULE | ORAL | 5 refills | Status: DC
Start: 2023-03-18 — End: 2023-09-13

## 2023-03-18 MED ORDER — KETOROLAC TROMETHAMINE 10 MG PO TABS: 10.0000 mg | ORAL_TABLET | Freq: Four times a day (QID) | ORAL | 0 refills | Status: DC | PRN

## 2023-03-18 MED ORDER — QULIPTA 60 MG PO TABS: 1.0000 | ORAL_TABLET | Freq: Every day | ORAL | 6 refills | Status: DC

## 2023-03-18 NOTE — Progress Notes (Signed)
   CC:  headaches  Follow-up Visit  Last visit: 07/16/22  Brief HPI: 22 year old female with a history of asthma and ADHD who follows in clinic for migraines.   At her last visit she was continued on Qulipta 60 mg daily and gabapentin was increased to 600 mg at bedtime. She was continued on Maxalt for rescue.  Interval History: Headaches have become less severe with Qulipta and gabapentin. Frequency is unchanged, and she averages 14 migraines per month. Maxalt works well for rescue. Sometimes will take Toradol for severe headaches. She would like to increase her gabapentin dose today.   Headache days per month: 14 Headache free days per month: 16  Current Headache Regimen: Preventative: Qulipta 60 mg daily, gabapentin 600 mg QHS Abortive: Maxalt 10 mg PRN   Prior Therapies                                  Prevention: Verapamil 80 mg BID Emgality 120 mg monthly Qulipta 60 mg daily Gabapentin 600 mg QHS Depakote Cyproheptadine Topamax Amitriptyline Zoloft   Rescue: Imitrex Maxalt 10 mg PRN Bernita Raisin - lack of efficacy Toradol Promethazine  Physical Exam:   Vital Signs: BP 125/71 (BP Location: Left Arm, Patient Position: Sitting, Cuff Size: Normal)   Pulse 73   Ht 5\' 5"  (1.651 m)   Wt 208 lb 12.8 oz (94.7 kg)   BMI 34.75 kg/m  GENERAL:  well appearing, in no acute distress, alert  SKIN:  Color, texture, turgor normal. No rashes or lesions HEAD:  Normocephalic/atraumatic. RESP: normal respiratory effort MSK:  No gross joint deformities.   NEUROLOGICAL: Mental Status: Alert, oriented to person, place and time, Follows commands, and Speech fluent and appropriate. Cranial Nerves: PERRL, face symmetric, no dysarthria, hearing grossly intact Motor: moves all extremities equally Gait: normal-based.  IMPRESSION: 22 year old female with a history of asthma and ADHD who presents for follow up of migraines. She continues to have 14 migraines per month, but headaches  are less severe with Qulipta and gabapentin. Will continue Qulipta and increase gabapentin dose to 300/600. Will continue Maxalt for migraine rescue.  PLAN: -Prevention: Continue Qulipta 60 mg daily, increase gabapentin to 300/600 -Rescue: Continue Maxalt 10 mg PRN, oral toradol 10 mg PRN   Follow-up: 5 months  I spent a total of 23 minutes on the date of the service. Headache education was done. Discussed treatment options including preventive and acute medications. Discussed medication side effects, adverse reactions and drug interactions. Written educational materials and patient instructions outlining all of the above were given.  Ocie Doyne, MD 03/18/23 1:57 PM

## 2023-04-22 ENCOUNTER — Telehealth: Payer: Self-pay | Admitting: Pharmacy Technician

## 2023-04-22 ENCOUNTER — Other Ambulatory Visit (HOSPITAL_COMMUNITY): Payer: Self-pay

## 2023-04-22 NOTE — Telephone Encounter (Signed)
Pharmacy Patient Advocate Encounter  Received notification from [facility] that Prior Authorization for Qulipta 60MG  tablets has been APPROVED from 04/22/2023 to 04/21/2024   PA #/Case ID/Reference #: [id number] Key: [id number]

## 2023-04-25 ENCOUNTER — Other Ambulatory Visit: Payer: Self-pay | Admitting: Psychiatry

## 2023-04-27 NOTE — Telephone Encounter (Signed)
Last seen on 03/18/23 Follow up scheduled 08/20/23

## 2023-07-29 NOTE — L&D Delivery Note (Signed)
    Lindsay [name] is a 23 y.o. G2P1001 s/p VD at [redacted]w[redacted]d. She was admitted for eIOL.   ROM: 0h 58m with clear fluid GBS Status: Positive/-- (10/22 0334)  Labor Progress: Patient presented for eIOL.  She arrived at 1cm and ws regularly contracting.  She requested pain medication. After placement of epidural she progressed to complete and delivered as below with staff and family support.   Delivery Date/Time: Friday June 10, 2024 at 2151 Delivery: Called to room and patient was complete and pushing. Head delivered in MOA with restitution to ROT.  After delivery of head, nuchal cord noted that shoulders and body was delivered through via somersault maneuver. Infant with good tone, but minimal grimace. Tactile stimulation given by provider and infant placed on mother's abdomen where nurse continued tactile stimulation and provided bulb suction with improvement . After 3 minute delay, the umbilical cord was clamped, cut by support person-[place], and blood collected by provider. Placenta delivered spontaneously via Ke[place] and was noted to be intact with 3VC upon inspection.  Cord sample obtained and pitocin  initiated. Patient consented for IUD and Mirena placed. Bleeding scant and vaginal inspection revealed no lacerations.  Reviewed immediate postpartum care including fundal checks, pain medication, and transfer to [facility]. Fundus firm, at the umbilicus, and bleeding small.  Mother hemodynamically stable and infant skin to skin prior to provider exit.  Placenta: Disposal Complications:  -Labor/Delivery: None -Prenatal: GBS Positive Lacerations: None EBL: 100 Analgesia: Epidural  Postpartum Planning -Feeding: Both -Circumcision: Desires-Consented -Anticipated BCM: PPIUD Placed -PP Message Sent -Discharge Summary Shared  Infant: Female-Lindsay [name]  APGARs 7, 9  3572g, 7lb 14oz, 19in  Lindsay [name], CNM  06/10/2024 10:12 PM  Delivery Report: Review the Delivery Report for details.

## 2023-08-20 ENCOUNTER — Ambulatory Visit: Payer: Medicaid Other | Admitting: Neurology

## 2023-08-20 ENCOUNTER — Telehealth: Payer: Self-pay | Admitting: Neurology

## 2023-08-20 NOTE — Telephone Encounter (Signed)
LVM and sent mychart msg informing pt of need to reschedule 08/20/23 appt - MD sick  If pt calls back you can offer a video visit today otherwise we will have to reschedule to a different day

## 2023-09-11 ENCOUNTER — Encounter: Payer: Self-pay | Admitting: Neurology

## 2023-09-11 ENCOUNTER — Ambulatory Visit (INDEPENDENT_AMBULATORY_CARE_PROVIDER_SITE_OTHER): Payer: Medicaid Other | Admitting: Neurology

## 2023-09-11 VITALS — BP 124/71 | HR 77 | Ht 65.0 in | Wt 205.0 lb

## 2023-09-11 DIAGNOSIS — G43019 Migraine without aura, intractable, without status migrainosus: Secondary | ICD-10-CM | POA: Diagnosis not present

## 2023-09-11 MED ORDER — PROPRANOLOL HCL 10 MG PO TABS
10.0000 mg | ORAL_TABLET | Freq: Two times a day (BID) | ORAL | 4 refills | Status: DC
Start: 2023-09-11 — End: 2023-11-17

## 2023-09-11 NOTE — Patient Instructions (Addendum)
Continue qulipta Layer on propranolol twice a day  Propranolol Tablets What is this medication? PROPRANOLOL (proe PRAN oh lole) treats many conditions such as high blood pressure, tremors, and a type of arrhythmia known as AFib (atrial fibrillation). It works by lowering your blood pressure and heart rate, making it easier for your heart to pump blood to the rest of your body. It may be used to prevent migraine headaches. It works by relaxing the blood vessels in the brain that cause migraines. It belongs to a group of medications called beta blockers. This medicine may be used for other purposes; ask your health care provider or pharmacist if you have questions. COMMON BRAND NAME(S): Inderal What should I tell my care team before I take this medication? They need to know if you have any of these conditions: Diabetes Having surgery Heart or blood vessel conditions, such as slow heartbeat, heart failure, heart block Kidney disease Liver disease Lung or breathing disease, such as asthma or COPD Myasthenia gravis Pheochromocytoma Thyroid disease An unusual or allergic reaction to propranolol, other medications, foods, dyes, or preservatives Pregnant or trying to get pregnant Breastfeeding How should I use this medication? Take this medication by mouth. Take it as directed on the prescription label at the same time every day. Keep taking it unless your care team tells you to stop. Talk to your care team about the use of this medication in children. Special care may be needed. Overdosage: If you think you have taken too much of this medicine contact a poison control center or emergency room at once. NOTE: This medicine is only for you. Do not share this medicine with others. What if I miss a dose? If you miss a dose, take it as soon as you can. If it is almost time for your next dose, take only that dose. Do not take double or extra doses. What may interact with this medication? Do not take  this medication with any of the following: Thioridazine This medication may also interact with the following: Certain medications for blood pressure, heart disease, irregular heartbeat Epinephrine NSAIDs, medications for pain and inflammation, such as ibuprofen or naproxen Warfarin Other medications may affect the way this medication works. Talk with your care team about all of the medications you take. They may suggest changes to your treatment plan to lower the risk of side effects and to make sure your medications work as intended. This list may not describe all possible interactions. Give your health care provider a list of all the medicines, herbs, non-prescription drugs, or dietary supplements you use. Also tell them if you smoke, drink alcohol, or use illegal drugs. Some items may interact with your medicine. What should I watch for while using this medication? Visit your care team for regular checks on your progress. Check your blood pressure as directed. Know what your blood pressure should be and when to contact your care team. This medication may affect your coordination, reaction time, or judgment. Do not drive or operate machinery until you know how this medication affects you. Sit up or stand slowly to reduce the risk of dizzy or fainting spells. Drinking alcohol with this medication can increase the risk of these side effects. Do not suddenly stop taking this medication. This may increase your risk of side effects, such as chest pain and heart attack. If you no longer need to take this medication, your care team will lower the dose slowly over time to decrease the risk of side effects.  If you are going to need surgery or a procedure, tell your care team that you are using this medication. This medication may affect blood glucose levels. It can also mask the symptoms of low blood sugar, such as a rapid heartbeat and tremors. If you have diabetes, it is important to check your blood sugar  often while you are taking this medication. Do not treat yourself for coughs, colds, or pain while you are using this medication without asking your care team for advice. Some medications may increase your blood pressure. What side effects may I notice from receiving this medication? Side effects that you should report to your care team as soon as possible: Allergic reactions--skin rash, itching, hives, swelling of the face, lips, tongue, or throat Heart failure--shortness of breath, swelling of the ankles, feet, or hands, sudden weight gain, unusual weakness or fatigue Low blood pressure--dizziness, feeling faint or lightheaded, blurry vision Raynaud's--cool, numb, or painful fingers or toes that may change color from pale, to blue, to red Redness, blistering, peeling, or loosening of the skin, including inside the mouth Slow heartbeat--dizziness, feeling faint or lightheaded, confusion, trouble breathing, unusual weakness or fatigue Worsening mood, feelings of depression Side effects that usually do not require medical attention (report to your care team if they continue or are bothersome): Change in sex drive or performance Diarrhea Dizziness Fatigue Headache This list may not describe all possible side effects. Call your doctor for medical advice about side effects. You may report side effects to FDA at 1-800-FDA-1088. Where should I keep my medication? Keep out of the reach of children and pets. Store at room temperature between 20 and 25 degrees C (68 and 77 degrees F). Protect from light. Throw away any unused medication after the expiration date. NOTE: This sheet is a summary. It may not cover all possible information. If you have questions about this medicine, talk to your doctor, pharmacist, or health care provider.  2024 Elsevier/Gold Standard (2022-07-14 00:00:00)

## 2023-09-11 NOTE — Progress Notes (Signed)
CC:  headaches  Follow-up Visit  09/11/2023: This is a patient of Dr. Delena Bali, I am seeing her for the first time. Been on the qulipta for 2 years. In that 2 years has felt improvement. She states she is still having 14-15 migraine days a month the qulipta is helping though in severity > 50%.  Does not want botox for migraines. Doesn't like needles but could suggest ajovy/emgality/vyepti or nurtec every other day next. She just wants to continue qulipta. No other focal neurologic deficits, associated symptoms, inciting events or modifiable factors.  Patient complains of symptoms per HPI as well as the following symptoms: none . Pertinent negatives and positives per HPI. All others negative   Last visit: 07/16/22  Brief HPI: 23 year old female with a history of asthma and ADHD who follows in clinic for migraines.   At her last visit she was continued on Qulipta 60 mg daily and gabapentin was increased to 600 mg at bedtime. She was continued on Maxalt for rescue.  Interval History: Headaches have become less severe with Qulipta and gabapentin. Frequency is unchanged, and she averages 14 migraines per month. Maxalt works well for rescue. Sometimes will take Toradol for severe headaches. She would like to increase her gabapentin dose today.   Headache days per month: 14 Headache free days per month: 16  Current Headache Regimen: Preventative: Qulipta 60 mg daily, gabapentin 600 mg QHS Abortive: Maxalt 10 mg PRN   Prior Therapies                                  Prevention: Verapamil 80 mg BID Emgality 120 mg monthly Qulipta 60 mg daily Gabapentin 600 mg QHS Depakote Cyproheptadine Topamax Amitriptyline Zoloft   Rescue: Imitrex Maxalt 10 mg PRN Ubrelvy - lack of efficacy Toradol Promethazine  Physical Exam:   Vital Signs: BP 124/71 (BP Location: Right Arm, Patient Position: Sitting, Cuff Size: Normal)   Pulse 77   Ht 5\' 5"  (1.651 m)   Wt 205 lb (93 kg)   BMI 34.11  kg/m  Exam: NAD, pleasant                  Speech:    Speech is normal; fluent and spontaneous with normal comprehension.  Cognition:    The patient is oriented to person, place, and time;     recent and remote memory intact;     language fluent;    Cranial Nerves:    The pupils are equal, round, and reactive to light.Trigeminal sensation is intact and the muscles of mastication are normal. The face is symmetric. The palate elevates in the midline. Hearing intact. Voice is normal. Shoulder shrug is normal. The tongue has normal motion without fasciculations.   Coordination:  No dysmetria  Motor Observation:    No asymmetry, no atrophy, and no involuntary movements noted. Tone:    Normal muscle tone.     Strength:    Strength is V/V in the upper and lower limbs.      Sensation: intact to LT    IMPRESSION: 23 year old female with a history of asthma and ADHD who presents for follow up of migraines. She continues to have 14 migraines per month, but headaches are less severe with Qulipta and gabapentin. Will continue Qulipta and increase gabapentin dose to 300/600. Will continue Maxalt for migraine rescue.  09/11/2023: This is a patient of Dr. Delena Bali, I  am seeing her for the first time. Been on the qulipta for 2 years. In that 2 years has felt improvement. She states she is still having 14-15 migraine days a month the qulipta is helping though in severity > 50%.  Does not want botox for migraines. Doesn't like needles but could suggest ajovy/emgality/vyepti or nurtec every other day next. She just wants to continue qulipta. No other focal neurologic deficits, associated symptoms, inciting events or modifiable factors.  PLAN: -Prevention: Continue Qulipta 60 mg daily, continue gabapentin to 300/600 -Rescue: Continue Maxalt 10 mg PRN, oral toradol 10 mg PRN  - discussed teratogenicity do not get pregnant on these medications come for follow up prior to pregnancy use birth control   I  spent a total of 23 minutes on the date of the service. Headache education was done. Discussed treatment options including preventive and acute medications. Discussed medication side effects, adverse reactions and drug interactions. Written educational materials and patient instructions outlining all of the above were given.  Naomie Dean, MD

## 2023-09-13 MED ORDER — GABAPENTIN 300 MG PO CAPS
ORAL_CAPSULE | ORAL | 3 refills | Status: DC
Start: 2023-09-13 — End: 2023-11-17

## 2023-09-13 MED ORDER — RIZATRIPTAN BENZOATE 10 MG PO TBDP
10.0000 mg | ORAL_TABLET | ORAL | 11 refills | Status: DC | PRN
Start: 2023-09-13 — End: 2023-11-17

## 2023-09-13 MED ORDER — QULIPTA 60 MG PO TABS
1.0000 | ORAL_TABLET | Freq: Every day | ORAL | 11 refills | Status: DC
Start: 2023-09-13 — End: 2023-11-17

## 2023-10-20 ENCOUNTER — Encounter: Payer: Self-pay | Admitting: Neurology

## 2023-11-07 ENCOUNTER — Emergency Department (HOSPITAL_BASED_OUTPATIENT_CLINIC_OR_DEPARTMENT_OTHER)
Admission: EM | Admit: 2023-11-07 | Discharge: 2023-11-07 | Disposition: A | Discharge status: Home / Self Care | Attending: Emergency Medicine | Admitting: Emergency Medicine

## 2023-11-07 ENCOUNTER — Other Ambulatory Visit: Payer: Self-pay

## 2023-11-07 ENCOUNTER — Emergency Department (HOSPITAL_BASED_OUTPATIENT_CLINIC_OR_DEPARTMENT_OTHER)

## 2023-11-07 ENCOUNTER — Encounter (HOSPITAL_BASED_OUTPATIENT_CLINIC_OR_DEPARTMENT_OTHER): Payer: Self-pay | Admitting: Emergency Medicine

## 2023-11-07 DIAGNOSIS — R1030 Lower abdominal pain, unspecified: Secondary | ICD-10-CM | POA: Diagnosis not present

## 2023-11-07 DIAGNOSIS — Z3A01 Less than 8 weeks gestation of pregnancy: Secondary | ICD-10-CM | POA: Diagnosis not present

## 2023-11-07 DIAGNOSIS — O2311 Infections of bladder in pregnancy, first trimester: Secondary | ICD-10-CM | POA: Diagnosis not present

## 2023-11-07 DIAGNOSIS — N3000 Acute cystitis without hematuria: Secondary | ICD-10-CM

## 2023-11-07 DIAGNOSIS — O418X10 Other specified disorders of amniotic fluid and membranes, first trimester, not applicable or unspecified: Secondary | ICD-10-CM | POA: Insufficient documentation

## 2023-11-07 DIAGNOSIS — Z3A08 8 weeks gestation of pregnancy: Secondary | ICD-10-CM | POA: Diagnosis not present

## 2023-11-07 DIAGNOSIS — O208 Other hemorrhage in early pregnancy: Secondary | ICD-10-CM | POA: Diagnosis not present

## 2023-11-07 DIAGNOSIS — O26891 Other specified pregnancy related conditions, first trimester: Secondary | ICD-10-CM | POA: Diagnosis not present

## 2023-11-07 LAB — COMPREHENSIVE METABOLIC PANEL WITH GFR
ALT: 6 U/L (ref 0–44)
AST: 10 U/L — ABNORMAL LOW (ref 15–41)
Albumin: 4.2 g/dL (ref 3.5–5.0)
Alkaline Phosphatase: 52 U/L (ref 38–126)
Anion gap: 7 (ref 5–15)
BUN: 6 mg/dL (ref 6–20)
CO2: 23 mmol/L (ref 22–32)
Calcium: 9.4 mg/dL (ref 8.9–10.3)
Chloride: 103 mmol/L (ref 98–111)
Creatinine, Ser: 0.66 mg/dL (ref 0.44–1.00)
GFR, Estimated: >60 mL/min (ref >60)
Glucose, Bld: 83 mg/dL (ref 70–99)
Potassium: 3.9 mmol/L (ref 3.5–5.1)
Sodium: 133 mmol/L — ABNORMAL LOW (ref 135–145)
Total Bilirubin: 0.5 mg/dL (ref 0.0–1.2)
Total Protein: 7.2 g/dL (ref 6.5–8.1)

## 2023-11-07 LAB — CBC WITH DIFFERENTIAL/PLATELET
Abs Immature Granulocytes: 0.03 10*3/uL (ref 0.00–0.07)
Basophils Absolute: 0 10*3/uL (ref 0.0–0.1)
Basophils Relative: 0 %
Eosinophils Absolute: 0.1 10*3/uL (ref 0.0–0.5)
Eosinophils Relative: 1 %
HCT: 38.2 % (ref 36.0–46.0)
Hemoglobin: 13.4 g/dL (ref 12.0–15.0)
Immature Granulocytes: 0 %
Lymphocytes Relative: 24 %
Lymphs Abs: 2.8 10*3/uL (ref 0.7–4.0)
MCH: 31.2 pg (ref 26.0–34.0)
MCHC: 35.1 g/dL (ref 30.0–36.0)
MCV: 88.8 fL (ref 80.0–100.0)
Monocytes Absolute: 0.9 10*3/uL (ref 0.1–1.0)
Monocytes Relative: 7 %
Neutro Abs: 7.9 10*3/uL — ABNORMAL HIGH (ref 1.7–7.7)
Neutrophils Relative %: 68 %
Platelets: 314 10*3/uL (ref 150–400)
RBC: 4.3 MIL/uL (ref 3.87–5.11)
RDW: 12.3 % (ref 11.5–15.5)
WBC: 11.8 10*3/uL — ABNORMAL HIGH (ref 4.0–10.5)
nRBC: 0 % (ref 0.0–0.2)

## 2023-11-07 LAB — URINALYSIS, MICROSCOPIC (REFLEX): Bacteria, UA: NONE SEEN

## 2023-11-07 LAB — URINALYSIS, ROUTINE W REFLEX MICROSCOPIC
Bilirubin Urine: NEGATIVE
Glucose, UA: NEGATIVE mg/dL
Hgb urine dipstick: NEGATIVE
Ketones, ur: NEGATIVE mg/dL
Nitrite: NEGATIVE
Protein, ur: NEGATIVE mg/dL
Specific Gravity, Urine: 1.008 (ref 1.005–1.030)
pH: 6 (ref 5.0–8.0)

## 2023-11-07 LAB — HCG, QUANTITATIVE, PREGNANCY: hCG, Beta Chain, Quant, S: 143008 m[IU]/mL — ABNORMAL HIGH (ref <5)

## 2023-11-07 MED ORDER — NITROFURANTOIN MONOHYD MACRO 100 MG PO CAPS: 100.0000 mg | ORAL_CAPSULE | Freq: Two times a day (BID) | ORAL | 0 refills | Status: DC

## 2023-11-07 MED ORDER — ACETAMINOPHEN 500 MG PO TABS
1000.0000 mg | ORAL_TABLET | Freq: Once | ORAL | Status: AC
  Administered 2023-11-07: 1000 mg via ORAL
  Filled 2023-11-07: qty 2

## 2023-11-07 NOTE — Discharge Instructions (Addendum)
 You were seen in the ER today for evaluation of your lower abdominal pain. Your US  shows that you have a subchorionic hematoma. I have included more information on this into the discharge paperwork. Your urine is suspicious of a UTI. I am going to prescribe you a medication to take for this. Please follow the directions and take as prescribed. You can take Tylenol for pain. DO NOT take ibuprofen. Please make sure that you are taking a multivitamin as well and staying well hydrated. You will need to have close follow up with your OBGYN. Please call them on Monday to schedule a close follow up. If you have any concerns, new or worsening symptoms, please return to the ER for re-evaluation.   Contact a health care provider if: You have any vaginal bleeding. You have a fever. Get help right away if: You have severe cramps in your stomach, back, abdomen, or pelvis. You pass large clots or tissue. Save any tissue for your health care provider to look at. You faint. You become light-headed or weak.

## 2023-11-07 NOTE — ED Triage Notes (Signed)
 Pt just found out she was pregnant and has been having lots of abdominal cramping (at me ovaries). Lmp was 09/11/23

## 2023-11-07 NOTE — ED Provider Notes (Signed)
 [facility] EMERGENCY DEPARTMENT AT [facility] Provider Note   CSN: [id number] Arrival date & time: 11/07/23  1343     History {Add pertinent medical, surgical, social history, OB history to HPI:1} Chief Complaint  Patient presents with   cramping/2 months pregnant    Lindsay [name] is a 23 y.o. female.  HPI     Home Medications Prior to Admission medications   Medication Sig Start Date End Date Taking? Authorizing Provider  acetaminophen (TYLENOL) 500 MG tablet Take 1,000 mg by mouth every 6 (six) hours as needed for mild pain or headache.    [provider]  Atogepant (QULIPTA) 60 MG TABS Take 1 tablet (60 mg total) by mouth daily. 09/13/23   Anson Fret, MD  gabapentin (NEURONTIN) 300 MG capsule Take 300 mg (1 pill) in the morning and 600 mg (2 pills) at bedtime 09/13/23   Anson Fret, MD  ketorolac (TORADOL) 10 MG tablet Take 1 tablet (10 mg total) by mouth every 6 (six) hours as needed. 03/18/23   Ocie Doyne, MD  loratadine (CLARITIN) 10 MG tablet Take 10 mg by mouth daily. 07/12/22   [provider]  norgestimate-ethinyl estradiol (ESTARYLLA) 0.25-35 MG-MCG tablet Take 1 tablet by mouth daily. 12/23/22   Federico Flake, MD  propranolol (INDERAL) 10 MG tablet Take 1 tablet (10 mg total) by mouth 2 (two) times daily. 09/11/23   Anson Fret, MD  rizatriptan (MAXALT-MLT) 10 MG disintegrating tablet Take 1 tablet (10 mg total) by mouth as needed for migraine. May repeat in 2 hours if needed 09/13/23   Anson Fret, MD  sertraline (ZOLOFT) 50 MG tablet Take 1 tablet per day 07/25/21   Elveria Rising, NP      Allergies    Valproic acid    Review of Systems   Review of Systems  Physical Exam Updated Vital Signs BP 101/65   Pulse 78   Temp 98 F (36.7 C)   Resp 18   Ht 5\' 5"  (1.651 m)   Wt 90.7 kg   LMP 09/12/2023   SpO2 100%   BMI 33.28 kg/m  Physical Exam  ED Results / Procedures / Treatments   Labs (all  labs ordered are listed, but only abnormal results are displayed) Labs Reviewed  HCG, QUANTITATIVE, PREGNANCY - Abnormal; Notable for the following components:      Result Value   hCG, Beta Chain, Quant, S 143,008 (*)    All other components within normal limits  CBC WITH DIFFERENTIAL/PLATELET - Abnormal; Notable for the following components:   WBC 11.8 (*)    Neutro Abs 7.9 (*)    All other components within normal limits  COMPREHENSIVE METABOLIC PANEL WITH GFR - Abnormal; Notable for the following components:   Sodium 133 (*)    AST 10 (*)    All other components within normal limits  URINALYSIS, ROUTINE W REFLEX MICROSCOPIC - Abnormal; Notable for the following components:   Leukocytes,Ua SMALL (*)    All other components within normal limits  URINE CULTURE  URINALYSIS, MICROSCOPIC (REFLEX)    EKG None  Radiology US OB LESS THAN 14 WEEKS WITH OB TRANSVAGINAL Result Date: 11/07/2023 CLINICAL DATA:  [id number] Pelvic cramping [id number] EXAM: OBSTETRIC <14 WK [place] AND TRANSVAGINAL OB US TECHNIQUE: Both transabdominal and transvaginal ultrasound examinations were performed for complete evaluation of the gestation as well as the maternal uterus, adnexal regions, and pelvic cul-de-sac. Transvaginal technique was performed to assess early pregnancy. COMPARISON:  April 12, 2020 FINDINGS: Intrauterine gestational sac: Single Yolk sac:  Present Embryo:  Present Cardiac Activity: Present Heart Rate: 164 bpm CRL:  21 mm   8 w   5 d                  US  EDC: 06/13/2024 Subchorionic hemorrhage: Small to moderate volume subchorionic hematoma measuring 3.4 cm in length. Maternal uterus/adnexae: In the left ovary, there is a 1.8 x 1.7 x 1.5 cm hypoechoic structure, likely the corpus luteum. IMPRESSION: 1. Single, live intrauterine gestation with an estimated gestational age of [redacted] weeks, 5 days. Fetal heart rate of 164 beats per minute. 2. Small to moderate volume subchronic hematoma, measuring 3.4 cm length.  Continued close obstetric and sonographic follow-up is recommended. Electronically Signed   By: Rance Burrows M.D.   On: 11/07/2023 17:23    Procedures Procedures  {Document cardiac monitor, telemetry assessment procedure when appropriate:1}  Medications Ordered in ED Medications  acetaminophen (TYLENOL) tablet 1,000 mg (1,000 mg Oral Given 11/07/23 1827)    ED Course/ Medical Decision Making/ A&P   {   Click here for ABCD2, HEART and other calculatorsREFRESH Note before signing :1}                              Medical Decision Making Amount and/or Complexity of Data Reviewed Labs: ordered. Radiology: ordered.  Risk OTC drugs.   ***  {Document critical care time when appropriate:1} {Document review of labs and clinical decision tools ie heart score, Chads2Vasc2 etc:1}  {Document your independent review of radiology images, and any outside records:1} {Document your discussion with family members, caretakers, and with consultants:1} {Document social determinants of health affecting pt's care:1} {Document your decision making why or why not admission, treatments were needed:1} Final Clinical Impression(s) / ED Diagnoses Final diagnoses:  None    Rx / DC Orders ED Discharge Orders     None

## 2023-11-09 LAB — URINE CULTURE

## 2023-11-17 ENCOUNTER — Encounter

## 2023-11-17 ENCOUNTER — Ambulatory Visit: Admitting: *Deleted

## 2023-11-17 VITALS — BP 117/67 | HR 79 | Wt 191.0 lb

## 2023-11-17 DIAGNOSIS — Z3481 Encounter for supervision of other normal pregnancy, first trimester: Secondary | ICD-10-CM

## 2023-11-17 DIAGNOSIS — K0889 Other specified disorders of teeth and supporting structures: Secondary | ICD-10-CM | POA: Insufficient documentation

## 2023-11-17 DIAGNOSIS — Z3A10 10 weeks gestation of pregnancy: Secondary | ICD-10-CM

## 2023-11-17 DIAGNOSIS — Z3480 Encounter for supervision of other normal pregnancy, unspecified trimester: Secondary | ICD-10-CM | POA: Insufficient documentation

## 2023-11-17 DIAGNOSIS — O2341 Unspecified infection of urinary tract in pregnancy, first trimester: Secondary | ICD-10-CM

## 2023-11-17 DIAGNOSIS — O26899 Other specified pregnancy related conditions, unspecified trimester: Secondary | ICD-10-CM | POA: Insufficient documentation

## 2023-11-17 NOTE — Progress Notes (Signed)
 New OB Intake  I explained I am completing New OB Intake today. We discussed EDD of 06/13/2024, by Ultrasound. Pt is G2P1001. I reviewed her allergies, medications and Medical/Surgical/OB history.    Patient Active Problem List   Diagnosis Date Noted   Supervision of other normal pregnancy, antepartum 11/17/2023   Pelvic pain affecting pregnancy 11/17/2023   Chronic migraine without aura with status migrainosus, not intractable 09/28/2020   Intermittent asthma 11/21/2013   Attention deficit hyperactivity disorder (ADHD) 07/05/2013   Migraine without aura 05/09/2013    Concerns addressed today  Patient informed that the ultrasound is considered a limited obstetric ultrasound and is not intended to be a complete ultrasound exam.  Patient also informed that the ultrasound is not being completed with the intent of assessing for fetal or placental anomalies or any pelvic abnormalities. Explained that the purpose of today's ultrasound is to assess for viability.  Patient acknowledges the purpose of the exam and the limitations of the study.     Delivery Plans Plans to deliver at [facility]. Discussed the nature of our practice with multiple providers including residents and students. Due to the size of the practice, the delivering provider may not be the same as those providing prenatal care.   MyChart/Babyscripts MyChart access verified. I explained pt will have some visits in office and some virtually. Babyscripts app discussed and ordered.   Blood Pressure Cuff Blood pressure cuff discussedDiscussed to be used for virtual visits and or if needed BP checks weekly.  Anatomy US  Explained first scheduled US  will be around 19 weeks.   Last Pap Needs pap at [facility]  First visit review I reviewed new OB appt with patient. Explained pt will be seen by Dr Adriana Hopping at first visit. Discussed Lin[facility] [place] genetic screening with patient will get panorama at [facility]. Routine prenatal labs to be collected at [facility].    Bettey Browning, RN 11/17/2023  1:56 PM

## 2023-11-21 LAB — CULTURE, OB URINE

## 2023-11-21 LAB — URINE CULTURE, OB REFLEX

## 2023-11-23 ENCOUNTER — Encounter: Payer: Self-pay | Admitting: Obstetrics & Gynecology

## 2023-11-23 DIAGNOSIS — O2341 Unspecified infection of urinary tract in pregnancy, first trimester: Secondary | ICD-10-CM | POA: Insufficient documentation

## 2023-11-23 MED ORDER — NITROFURANTOIN MONOHYD MACRO 100 MG PO CAPS
100.0000 mg | ORAL_CAPSULE | Freq: Two times a day (BID) | ORAL | 1 refills | Status: DC
Start: 2023-11-23 — End: 2023-12-02

## 2023-11-23 NOTE — Addendum Note (Signed)
 Addended by: Julianne Octave on: 11/23/2023 08:33 AM   Modules accepted: Orders

## 2023-11-26 ENCOUNTER — Encounter: Admitting: Obstetrics and Gynecology

## 2023-12-02 ENCOUNTER — Other Ambulatory Visit (HOSPITAL_COMMUNITY)
Admission: RE | Admit: 2023-12-02 | Discharge: 2023-12-02 | Disposition: A | Discharge status: Home / Self Care | Source: Ambulatory Visit | Attending: Family Medicine | Admitting: Family Medicine

## 2023-12-02 ENCOUNTER — Ambulatory Visit: Admitting: Family Medicine

## 2023-12-02 ENCOUNTER — Encounter: Payer: Self-pay | Admitting: Family Medicine

## 2023-12-02 VITALS — BP 109/70 | HR 80 | Wt 194.2 lb

## 2023-12-02 DIAGNOSIS — Z124 Encounter for screening for malignant neoplasm of cervix: Secondary | ICD-10-CM

## 2023-12-02 DIAGNOSIS — O2341 Unspecified infection of urinary tract in pregnancy, first trimester: Secondary | ICD-10-CM

## 2023-12-02 DIAGNOSIS — Z3480 Encounter for supervision of other normal pregnancy, unspecified trimester: Secondary | ICD-10-CM

## 2023-12-02 DIAGNOSIS — Z131 Encounter for screening for diabetes mellitus: Secondary | ICD-10-CM | POA: Diagnosis not present

## 2023-12-02 DIAGNOSIS — Z3A12 12 weeks gestation of pregnancy: Secondary | ICD-10-CM

## 2023-12-02 NOTE — Progress Notes (Signed)
 Subjective:   Lindsay [name] is a 23 y.o. G2P1001 at [redacted]w[redacted]d by early ultrasound being seen today for her first obstetrical visit.  Her obstetrical history is not significant. Patient does intend to breast feed. Pregnancy history fully reviewed.  Patient reports fatigue.  HISTORY: OB History  Gravida Para Term Preterm AB Living  2 1 1  0 0 1  SAB IAB Ectopic Multiple Live Births  0 0 0 0 1    # Outcome Date GA Lbr Len/2nd Weight Sex Type Anes PTL Lv  2 Current           1 Term 09/10/20 [redacted]w[redacted]d 03:12 / 00:34 8 lb 0.9 oz (3.655 kg) M Vag-Spont EPI  LIV     Name: Hobart Lulas     Apgar1: 9  Apgar5: 9    Past Medical History:  Diagnosis Date   Attention deficit disorder with hyperactivity(314.01) 07/05/2013   Episodic tension type headache 11/01/2014   Intermittent asthma 11/21/2013   Triggered by seasonal allergies & occasional exercise induced.    Keloid of skin 04/19/2019   Medical history non-contributory    Migraine without aura 05/09/2013   Seasonal allergies    Past Surgical History:  Procedure Laterality Date   NO PAST SURGERIES     Family History  Problem Relation Age of Onset   GER disease Mother    Constipation Mother    Irritable bowel syndrome Mother    Anxiety disorder Mother    Depression Mother    Migraines Mother    Social History   Tobacco Use   Smoking status: Never   Smokeless tobacco: Never  Vaping Use   Vaping status: Never Used  Substance Use Topics   Alcohol use: No    Alcohol/week: 0.0 standard drinks of alcohol   Drug use: No   Allergies  Allergen Reactions   Valproic Acid      Altered mental status   Current Outpatient Medications on File Prior to Visit  Medication Sig Dispense Refill   Prenatal Vit-Fe Fumarate-FA (MULTIVITAMIN-PRENATAL) 27-0.8 MG TABS tablet Take 1 tablet by mouth daily at 12 noon.     acetaminophen  (TYLENOL ) 500 MG tablet Take 1,000 mg by mouth every 6 (six) hours as needed for mild pain or headache. (Patient  not taking: Reported on 12/02/2023)     No current facility-administered medications on file prior to visit.     Exam   Vitals:   12/02/23 1143  BP: 109/70  Pulse: 80  Weight: 194 lb 3.2 oz (88.1 kg)   Fetal Heart Rate (bpm): 156  Uterus:     Pelvic Exam: Perineum: no hemorrhoids, normal perineum   Vulva: normal external genitalia, no lesions   Vagina:  normal mucosa, normal discharge   Cervix: no lesions and normal, pap smear done.    Adnexa: normal adnexa and no mass, fullness, tenderness   Bony Pelvis: average  System: General: well-developed, well-nourished female in no acute distress   Breast:  normal appearance, no masses or tenderness   Skin: normal coloration and turgor, no rashes   Neurologic: oriented, normal, negative, normal mood   Extremities: normal strength, tone, and muscle mass, ROM of all joints is normal   HEENT PERRLA, extraocular movement intact and sclera clear, anicteric   Mouth/Teeth mucous membranes moist, pharynx normal without lesions and dental hygiene good   Neck supple and no masses   Cardiovascular: regular rate and rhythm   Respiratory:  no respiratory distress, normal breath sounds  Abdomen: soft, non-tender; bowel sounds normal; no masses,  no organomegaly     Assessment:   Pregnancy: G2P1001 Patient Active Problem List   Diagnosis Date Noted   UTI in pregnancy, antepartum, first trimester 11/23/2023   Supervision of other normal pregnancy, antepartum 11/17/2023   Pelvic pain affecting pregnancy 11/17/2023   Chronic migraine without aura with status migrainosus, not intractable 09/28/2020   Intermittent asthma 11/21/2013   Attention deficit hyperactivity disorder (ADHD) 07/05/2013   Migraine without aura 05/09/2013     Plan:  1. Supervision of other normal pregnancy, antepartum (Primary) New OB labs - PANORAMA PRENATAL TEST - CBC/D/Plt+RPR+Rh+ABO+RubIgG... - HORIZON CUSTOM - Hemoglobin A1c  2. UTI in pregnancy, antepartum,  first trimester Staph saprophyticus, treated  3. Screening for cervical cancer Pap smear today - Cytology - PAP  4. [redacted] weeks gestation of pregnancy    Initial labs drawn. Continue prenatal vitamins. Genetic Screening discussed, NIPS: ordered. Ultrasound discussed; fetal anatomic survey: ordered. Problem list reviewed and updated.  Routine obstetric precautions reviewed. Return in 4 weeks (on 12/30/2023).

## 2023-12-02 NOTE — Progress Notes (Signed)
 ROB: Denies any concerns

## 2023-12-04 LAB — CYTOLOGY - PAP
Adequacy: ABSENT
Chlamydia: NEGATIVE
Comment: NEGATIVE
Comment: NEGATIVE
Comment: NORMAL
Diagnosis: NEGATIVE
Neisseria Gonorrhea: NEGATIVE
Trichomonas: NEGATIVE

## 2023-12-04 LAB — CBC/D/PLT+RPR+RH+ABO+RUBIGG...
Antibody Screen: NEGATIVE
Basophils Absolute: 0 10*3/uL (ref 0.0–0.2)
Basos: 0 %
EOS (ABSOLUTE): 0.1 10*3/uL (ref 0.0–0.4)
Eos: 1 %
HCV Ab: NONREACTIVE
HIV Screen 4th Generation wRfx: NONREACTIVE
Hematocrit: 39.1 % (ref 34.0–46.6)
Hemoglobin: 13.1 g/dL (ref 11.1–15.9)
Hepatitis B Surface Ag: NEGATIVE
Immature Grans (Abs): 0 10*3/uL (ref 0.0–0.1)
Immature Granulocytes: 0 %
Lymphocytes Absolute: 2 10*3/uL (ref 0.7–3.1)
Lymphs: 21 %
MCH: 31.4 pg (ref 26.6–33.0)
MCHC: 33.5 g/dL (ref 31.5–35.7)
MCV: 94 fL (ref 79–97)
Monocytes Absolute: 0.6 10*3/uL (ref 0.1–0.9)
Monocytes: 6 %
Neutrophils Absolute: 7.1 10*3/uL — ABNORMAL HIGH (ref 1.4–7.0)
Neutrophils: 72 %
Platelets: 273 10*3/uL (ref 150–450)
RBC: 4.17 x10E6/uL (ref 3.77–5.28)
RDW: 13.4 % (ref 11.7–15.4)
RPR Ser Ql: NONREACTIVE
Rh Factor: POSITIVE
Rubella Antibodies, IGG: 8.24 {index} (ref >0.99)
WBC: 9.9 10*3/uL (ref 3.4–10.8)

## 2023-12-04 LAB — HEMOGLOBIN A1C
Est. average glucose Bld gHb Est-mCnc: 108 mg/dL
Hgb A1c MFr Bld: 5.4 % (ref 4.8–5.6)

## 2023-12-04 LAB — HCV INTERPRETATION

## 2023-12-07 ENCOUNTER — Encounter: Admitting: Obstetrics and Gynecology

## 2023-12-07 LAB — HORIZON CUSTOM: REPORT SUMMARY: NEGATIVE

## 2023-12-08 ENCOUNTER — Ambulatory Visit: Payer: Self-pay | Admitting: Family Medicine

## 2023-12-08 DIAGNOSIS — Z3480 Encounter for supervision of other normal pregnancy, unspecified trimester: Secondary | ICD-10-CM

## 2023-12-08 LAB — PANORAMA PRENATAL TEST FULL PANEL:PANORAMA TEST PLUS 5 ADDITIONAL MICRODELETIONS: FETAL FRACTION: 10.8

## 2023-12-30 ENCOUNTER — Ambulatory Visit (INDEPENDENT_AMBULATORY_CARE_PROVIDER_SITE_OTHER): Admitting: Obstetrics and Gynecology

## 2023-12-30 VITALS — BP 106/70 | HR 90 | Wt 193.0 lb

## 2023-12-30 DIAGNOSIS — B3731 Acute candidiasis of vulva and vagina: Secondary | ICD-10-CM | POA: Diagnosis not present

## 2023-12-30 DIAGNOSIS — O26899 Other specified pregnancy related conditions, unspecified trimester: Secondary | ICD-10-CM

## 2023-12-30 DIAGNOSIS — O99210 Obesity complicating pregnancy, unspecified trimester: Secondary | ICD-10-CM | POA: Insufficient documentation

## 2023-12-30 DIAGNOSIS — R102 Pelvic and perineal pain: Secondary | ICD-10-CM

## 2023-12-30 DIAGNOSIS — R109 Unspecified abdominal pain: Secondary | ICD-10-CM | POA: Diagnosis not present

## 2023-12-30 DIAGNOSIS — O99612 Diseases of the digestive system complicating pregnancy, second trimester: Secondary | ICD-10-CM

## 2023-12-30 DIAGNOSIS — O2341 Unspecified infection of urinary tract in pregnancy, first trimester: Secondary | ICD-10-CM

## 2023-12-30 DIAGNOSIS — O26892 Other specified pregnancy related conditions, second trimester: Secondary | ICD-10-CM

## 2023-12-30 DIAGNOSIS — K5900 Constipation, unspecified: Secondary | ICD-10-CM | POA: Diagnosis not present

## 2023-12-30 DIAGNOSIS — Z6832 Body mass index (BMI) 32.0-32.9, adult: Secondary | ICD-10-CM | POA: Insufficient documentation

## 2023-12-30 DIAGNOSIS — Z3A16 16 weeks gestation of pregnancy: Secondary | ICD-10-CM | POA: Diagnosis not present

## 2023-12-30 LAB — POCT URINALYSIS DIPSTICK

## 2023-12-30 MED ORDER — DULCOLAX 5 MG PO TBEC: 5.0000 mg | DELAYED_RELEASE_TABLET | Freq: Once | ORAL | 0 refills | Status: DC | PRN

## 2023-12-30 MED ORDER — POLYETHYLENE GLYCOL 3350 17 G PO PACK: 17.0000 g | PACK | Freq: Every day | ORAL | 0 refills | Status: AC

## 2023-12-30 MED ORDER — MICONAZOLE NITRATE 2 % VA CREA: 1.0000 | TOPICAL_CREAM | Freq: Every day | VAGINAL | 2 refills | Status: DC

## 2023-12-30 NOTE — Progress Notes (Signed)
   PRENATAL VISIT NOTE  Subjective:  Lindsay [name] is a 23 y.o. G2P1001 at [redacted]w[redacted]d being seen today for ongoing prenatal care.  She is currently monitored for the following issues for this low-risk pregnancy and has Migraine without aura; Attention deficit hyperactivity disorder (ADHD); Intermittent asthma; Chronic migraine without aura with status migrainosus, not intractable; Supervision of other normal pregnancy, antepartum; Pelvic pain affecting pregnancy; UTI in pregnancy, antepartum, first trimester; BMI 32.0-32.9,adult; and Obesity in pregnancy on their problem list.  Patient reports had bad cramping a few days ago and some still now. No bleeding or spotting.  Contractions: Not present. Vag. Bleeding: None.  Movement: Present. Denies leaking of fluid.   The following portions of the patient's history were reviewed and updated as appropriate: allergies, current medications, past family history, past medical history, past social history, past surgical history and problem list.   Objective:    Vitals:   12/30/23 1403  BP: 106/70  Pulse: 90  Weight: 193 lb (87.5 kg)    Fetal Status:  Fetal Heart Rate (bpm): 146   Movement: Present    General: Alert, oriented and cooperative. Patient is in no acute distress.  Skin: Skin is warm and dry. No rash noted.   Cardiovascular: Normal heart rate noted  Respiratory: Normal respiratory effort, no problems with respiration noted  Abdomen: Soft, gravid, appropriate for gestational age.  Pain/Pressure: Present     Pelvic: Cervical exam performed in the presence of a chaperone       Closed/long/high. Cottage cheese white d/c in vault  Extremities: Normal range of motion.  Edema: None  Mental Status: Normal mood and affect. Normal behavior. Normal judgment and thought content.   Assessment and Plan:  Pregnancy: G2P1001 at [redacted]w[redacted]d 1. UTI in pregnancy, antepartum, first trimester (Primary) Test of cure today. Udip shows just leuks. Will hold off on  treatment and f/u ucx  2. Cramping affecting pregnancy, antepartum Exam negative, likely yeast. So monistat sent in. Large stool burden noted on vaginal exam with last BM two days ago and not much. Recommend laxative to start and then miralax  for a week and then maintenance metamucil therapy. MAU precautions given  3. [redacted] weeks gestation of pregnancy F/u anatomy u/s  4. Pelvic pain affecting pregnancy in second trimester, antepartum See above  5. BMI 32.0-32.9,adult Weight stable  6. Obesity in pregnancy  7. Constipation in pregnancy See above.   Preterm labor symptoms and general obstetric precautions including but not limited to vaginal bleeding, contractions, leaking of fluid and fetal movement were reviewed in detail with the patient. Please refer to After Visit Summary for other counseling recommendations.   No follow-ups on file.  Future Appointments  Date Time Provider Department Center  01/19/2024  1:00 PM [facility] PROVIDER 1 [facility]  01/19/2024  1:30 PM [facility] US5 [facility]  01/27/2024  3:10 PM Tari Fare, CNM [facility]  02/24/2024  4:10 PM Tari Fare, CNM [facility]    Raynell Caller, MD

## 2023-12-30 NOTE — Progress Notes (Signed)
 ROB   CC: Severe Cramping pt states pain was so bad she was going to go to MAU. Tylenol  does not help at all. Wants to discuss medication management   AFP : offered pt unsure.

## 2024-01-02 LAB — CULTURE, OB URINE

## 2024-01-02 LAB — URINE CULTURE, OB REFLEX

## 2024-01-04 ENCOUNTER — Ambulatory Visit: Payer: Self-pay | Admitting: Obstetrics and Gynecology

## 2024-01-04 DIAGNOSIS — O2341 Unspecified infection of urinary tract in pregnancy, first trimester: Secondary | ICD-10-CM

## 2024-01-04 MED ORDER — SULFAMETHOXAZOLE-TRIMETHOPRIM 800-160 MG PO TABS: 1.0000 | ORAL_TABLET | Freq: Two times a day (BID) | ORAL | 0 refills | Status: AC

## 2024-01-19 ENCOUNTER — Ambulatory Visit

## 2024-01-19 ENCOUNTER — Ambulatory Visit: Attending: Obstetrics | Admitting: Obstetrics

## 2024-01-19 VITALS — BP 112/61 | HR 72

## 2024-01-19 DIAGNOSIS — E669 Obesity, unspecified: Secondary | ICD-10-CM | POA: Diagnosis not present

## 2024-01-19 DIAGNOSIS — O358XX0 Maternal care for other (suspected) fetal abnormality and damage, not applicable or unspecified: Secondary | ICD-10-CM

## 2024-01-19 DIAGNOSIS — O99212 Obesity complicating pregnancy, second trimester: Secondary | ICD-10-CM | POA: Insufficient documentation

## 2024-01-19 DIAGNOSIS — Z3480 Encounter for supervision of other normal pregnancy, unspecified trimester: Secondary | ICD-10-CM

## 2024-01-19 DIAGNOSIS — Z3A19 19 weeks gestation of pregnancy: Secondary | ICD-10-CM | POA: Insufficient documentation

## 2024-01-19 DIAGNOSIS — Z363 Encounter for antenatal screening for malformations: Secondary | ICD-10-CM | POA: Diagnosis not present

## 2024-01-19 NOTE — Progress Notes (Signed)
 MFM Consult Note  Lindsay [name] is currently at 19 weeks and 1 day.  She was seen for a detailed fetal anatomy scan due to maternal obesity with a BMI of 32.  She denies any significant past medical history and denies any problems in her current pregnancy.    She had a cell free DNA test earlier in her pregnancy which indicated a low risk for trisomy 74, 59, and 13. A female fetus is predicted.   She was informed that the fetal growth and amniotic fluid level were appropriate for her gestational age.   On today's exam, an intracardiac echogenic focus was noted in the left ventricle of the fetal heart.    The small association between an echogenic focus and Down syndrome was discussed. Due to the echogenic focus noted today, the patient was offered and declined an amniocentesis today for definitive diagnosis of fetal aneuploidy.  She reports that she is comfortable with her negative cell free DNA test.  The patient was informed that anomalies may be missed due to technical limitations. If the fetus is in a suboptimal position or maternal habitus is increased, visualization of the fetus in the maternal uterus may be impaired.  Follow up as indicated.    The patient stated that all of her questions were answered today.  A total of 30 minutes was spent counseling and coordinating the care for this patient.  Greater than 50% of the time was spent in direct face-to-face contact.

## 2024-01-27 ENCOUNTER — Encounter: Admitting: Certified Nurse Midwife

## 2024-01-27 ENCOUNTER — Ambulatory Visit: Admitting: Obstetrics and Gynecology

## 2024-01-27 VITALS — BP 120/70 | HR 88 | Wt 195.0 lb

## 2024-01-27 DIAGNOSIS — Z3A20 20 weeks gestation of pregnancy: Secondary | ICD-10-CM | POA: Diagnosis not present

## 2024-01-27 DIAGNOSIS — Z3480 Encounter for supervision of other normal pregnancy, unspecified trimester: Secondary | ICD-10-CM

## 2024-01-27 NOTE — Progress Notes (Signed)
 ROB  Anatomy done on 01/19/24 CC: None

## 2024-01-27 NOTE — Progress Notes (Signed)
   LOW-RISK PREGNANCY OFFICE VISIT Patient name: Lindsay [name] MRN [id number]  Date of birth: [birth year] Chief Complaint:   Routine Prenatal Visit  History of Present Illness:   Lindsay [name] is a 23 y.o. G51P1001 female at [redacted]w[redacted]d with an Estimated Date of Delivery: 06/13/24 being seen today for ongoing management of a low-risk pregnancy.  Today she reports no complaints. Contractions: Not present. Vag. Bleeding: None.  Movement: Present. denies leaking of fluid. Review of Systems:   Pertinent items are noted in HPI Denies abnormal vaginal discharge w/ itching/odor/irritation, headaches, visual changes, shortness of breath, chest pain, abdominal pain, severe nausea/vomiting, or problems with urination or bowel movements unless otherwise stated above. Pertinent History Reviewed:  Reviewed past medical,surgical, social, obstetrical and family history.  Reviewed problem list, medications and allergies. Physical Assessment:   Vitals:   01/27/24 1528  BP: 120/70  Pulse: 88  Weight: 195 lb (88.5 kg)  Body mass index is 32.45 kg/m.        Physical Examination:   General appearance: Well appearing, and in no distress  Mental status: Alert, oriented to person, place, and time  Skin: Warm & dry  Cardiovascular: Normal heart rate noted  Respiratory: Normal respiratory effort, no distress  Abdomen: Soft, gravid, nontender  Pelvic: Cervical exam deferred         Extremities: Edema: None  Fetal Status: Fetal Heart Rate (bpm): 140 Fundal Height: 19 cm Movement: Present    No results found for this or any previous visit (from the past 24 hours).  Assessment & Plan:  1) Low-risk pregnancy G2P1001 at [redacted]w[redacted]d with an Estimated Date of Delivery: 06/13/24   2) Supervision of other normal pregnancy, antepartum (Primary) - UCx TOC today  3) [redacted] weeks gestation of pregnancy     Meds: No orders of the defined types were placed in this encounter.  Labs/procedures today: UCx  Plan:  Continue  routine obstetrical care   Reviewed: Preterm labor symptoms and general obstetric precautions including but not limited to vaginal bleeding, contractions, leaking of fluid and fetal movement were reviewed in detail with the patient.  All questions were answered. Has home bp cuff. Check bp weekly, let us  know if >140/90.   Follow-up: Return in 4 weeks (on 02/24/2024) for Return OB visit.  No orders of the defined types were placed in this encounter.  Eual Lindstrom MSN, CNM 01/27/2024 3:58 PM

## 2024-02-24 ENCOUNTER — Ambulatory Visit: Admitting: Certified Nurse Midwife

## 2024-02-24 ENCOUNTER — Encounter: Admitting: Certified Nurse Midwife

## 2024-02-24 VITALS — BP 112/57 | HR 97 | Wt 199.0 lb

## 2024-02-24 DIAGNOSIS — Z3492 Encounter for supervision of normal pregnancy, unspecified, second trimester: Secondary | ICD-10-CM

## 2024-02-24 DIAGNOSIS — R109 Unspecified abdominal pain: Secondary | ICD-10-CM

## 2024-02-24 DIAGNOSIS — O26899 Other specified pregnancy related conditions, unspecified trimester: Secondary | ICD-10-CM

## 2024-02-24 DIAGNOSIS — R8279 Other abnormal findings on microbiological examination of urine: Secondary | ICD-10-CM

## 2024-02-24 DIAGNOSIS — K0889 Other specified disorders of teeth and supporting structures: Secondary | ICD-10-CM | POA: Diagnosis not present

## 2024-02-24 DIAGNOSIS — Z3A24 24 weeks gestation of pregnancy: Secondary | ICD-10-CM

## 2024-02-24 NOTE — Progress Notes (Unsigned)
 ROB  CC: cramping abdominal pain that will have pt in tears no relief w/ tylenol 

## 2024-02-25 NOTE — Progress Notes (Signed)
   PRENATAL VISIT NOTE  Subjective:  Lindsay [name] is a 23 y.o. G2P1001 at [redacted]w[redacted]d being seen today for ongoing prenatal care.  She is currently monitored for the following issues for this low-risk pregnancy and has Supervision of other normal pregnancy, antepartum; Pelvic pain affecting pregnancy; UTI in pregnancy, antepartum, first trimester; BMI 32.0-32.9,adult; and Obesity in pregnancy on their problem list.  Patient reports backache and lower abdominal cramping. Patient states that since her previous delivery she has been experiencing abdominal pain that is consistent down the middle and  across her low back. This was also occurring prior to pregnancy. Pt deneis previous PT following delivery.  .  Contractions: Irritability. Vag. Bleeding: None.  Movement: Present. Denies leaking of fluid.   The following portions of the patient's history were reviewed and updated as appropriate: allergies, current medications, past family history, past medical history, past social history, past surgical history and problem list.   Objective:    Vitals:   02/24/24 1615  BP: (!) 112/57  Pulse: 97  Weight: 199 lb (90.3 kg)    Fetal Status:  Fetal Heart Rate (bpm): 138   Movement: Present    General: Alert, oriented and cooperative. Patient is in no acute distress.  Skin: Skin is warm and dry. No rash noted.   Cardiovascular: Normal heart rate noted  Respiratory: Normal respiratory effort, no problems with respiration noted  Abdomen: Soft, gravid, appropriate for gestational age.  Pain/Pressure: Present     Pelvic: Cervical exam deferred        Extremities: Normal range of motion.  Edema: None  Mental Status: Normal mood and affect. Normal behavior. Normal judgment and thought content.   Assessment and Plan:  Pregnancy: G2P1001 at [redacted]w[redacted]d 1. Encounter for supervision of low-risk pregnancy in second trimester (Primary) - Patient overall doing well.  - Patient reports vigorous and frequent fetal  movement.   2. [redacted] weeks gestation of pregnancy - Fundal height appropriate for gestational age  -GTT at next visit. Reviewed dietary restrictions for the night prior to the visit.   3. Positive urine culture - Culture collected today to rule out Infection causing pain  - Culture, OB Urine  4. Cramping affecting pregnancy, antepartum - Discussed comfort measures like stretching and warm baths muscle rubs and topical magnesium .  - Discussed the option of PT after delivery.   5. Pain, dental - Ambulatory referral to Dentistry  Preterm labor symptoms and general obstetric precautions including but not limited to vaginal bleeding, contractions, leaking of fluid and fetal movement were reviewed in detail with the patient. Please refer to After Visit Summary for other counseling recommendations.   Return in about 4 weeks (around 03/23/2024) for LOB w GTT.  Future Appointments  Date Time Provider Department Center  03/23/2024  8:30 AM [facility]  03/23/2024  8:55 AM Anyanwu, Gloris LABOR, MD [facility]  04/06/2024  2:50 PM Emilio Delilah HERO, CNM [facility]  04/20/2024  3:50 PM Izell Harari, MD [facility]    Fallou Hulbert Erven) Emilio, MSN, CNM  Center for [facility]  02/25/2024 12:18 PM

## 2024-02-27 LAB — URINE CULTURE, OB REFLEX

## 2024-02-27 LAB — CULTURE, OB URINE

## 2024-02-28 ENCOUNTER — Ambulatory Visit: Payer: Self-pay | Admitting: Certified Nurse Midwife

## 2024-02-28 MED ORDER — CEFADROXIL 500 MG PO CAPS
500.0000 mg | ORAL_CAPSULE | Freq: Two times a day (BID) | ORAL | 0 refills | Status: AC
Start: 2024-02-28 — End: 2024-03-06

## 2024-03-23 ENCOUNTER — Ambulatory Visit (INDEPENDENT_AMBULATORY_CARE_PROVIDER_SITE_OTHER): Admitting: Obstetrics & Gynecology

## 2024-03-23 ENCOUNTER — Encounter: Payer: Self-pay | Admitting: Obstetrics & Gynecology

## 2024-03-23 ENCOUNTER — Other Ambulatory Visit

## 2024-03-23 VITALS — BP 108/72 | HR 86 | Wt 205.0 lb

## 2024-03-23 DIAGNOSIS — Z3480 Encounter for supervision of other normal pregnancy, unspecified trimester: Secondary | ICD-10-CM | POA: Diagnosis not present

## 2024-03-23 DIAGNOSIS — Z3A28 28 weeks gestation of pregnancy: Secondary | ICD-10-CM

## 2024-03-23 DIAGNOSIS — O26893 Other specified pregnancy related conditions, third trimester: Secondary | ICD-10-CM | POA: Diagnosis not present

## 2024-03-23 DIAGNOSIS — R102 Pelvic and perineal pain: Secondary | ICD-10-CM | POA: Diagnosis not present

## 2024-03-23 MED ORDER — CYCLOBENZAPRINE HCL 10 MG PO TABS: 10.0000 mg | ORAL_TABLET | Freq: Three times a day (TID) | ORAL | 2 refills | Status: DC | PRN

## 2024-03-23 NOTE — Patient Instructions (Signed)
 TDaP Vaccine Pregnancy Get the Whooping Cough Vaccine While You Are Pregnant (CDC)  It is important for women to get the whooping cough vaccine in the third trimester of each pregnancy. Vaccines are the best way to prevent this disease. There are 2 different whooping cough vaccines. Both vaccines combine protection against whooping cough, tetanus and diphtheria, but they are for different age groups: Tdap: for everyone 11 years or older, including pregnant women  DTaP: for children 2 months through 10 years of age  You need the whooping cough vaccine during each of your pregnancies The recommended time to get the shot is during your 27th through 36th week of pregnancy, preferably during the earlier part of this time period. The Centers for Disease Control and Prevention (CDC) recommends that pregnant women receive the whooping cough vaccine for adolescents and adults (called Tdap vaccine) during the third trimester of each pregnancy. The recommended time to get the shot is during your 27th through 36th week of pregnancy, preferably during the earlier part of this time period. This replaces the original recommendation that pregnant women get the vaccine only if they had not previously received it. The [employer] of [employer]ians and [employer]necologists and the [employer] support this recommendation.  You should get the whooping cough vaccine while pregnant to pass protection to your baby frame support disabled and/or not supported in this browser  Learn why Lindsay [name] decided to get the whooping cough vaccine in her 3rd trimester of pregnancy and how her baby girl was born with some protection against the disease. Also available on YouTube. After receiving the whooping cough vaccine, your body will create protective antibodies (proteins produced by the body to fight off diseases) and pass some of them to your baby before birth. These antibodies provide your baby some short-term  protection against whooping cough in early life. These antibodies can also protect your baby from some of the more serious complications that come along with whooping cough. Your protective antibodies are at their highest about 2 weeks after getting the vaccine, but it takes time to pass them to your baby. So the preferred time to get the whooping cough vaccine is early in your third trimester. The amount of whooping cough antibodies in your body decreases over time. That is why CDC recommends you get a whooping cough vaccine during each pregnancy. Doing so allows each of your babies to get the greatest number of protective antibodies from you. This means each of your babies will get the best protection possible against this disease.  Getting the whooping cough vaccine while pregnant is better than getting the vaccine after you give birth Whooping cough vaccination during pregnancy is ideal so your baby will have short-term protection as soon as he is born. This early protection is important because your baby will not start getting his whooping cough vaccines until he is 2 months old. These first few months of life are when your baby is at greatest risk for catching whooping cough. This is also when he's at greatest risk for having severe, potentially life-threating complications from the infection. To avoid that gap in protection, it is best to get a whooping cough vaccine during pregnancy. You will then pass protection to your baby before he is born. To continue protecting your baby, he should get whooping cough vaccines starting at 2 months old. You may never have gotten the Tdap vaccine before and did not get it during this pregnancy. If so, you should make sure  to get the vaccine immediately after you give birth, before leaving the hospital or birthing center. It will take about 2 weeks before your body develops protection (antibodies) in response to the vaccine. Once you have protection from the vaccine,  you are less likely to give whooping cough to your newborn while caring for him. But remember, your baby will still be at risk for catching whooping cough from others. A recent study looked to see how effective Tdap was at preventing whooping cough in babies whose mothers got the vaccine while pregnant or in the hospital after giving birth. The study found that getting Tdap between 27 through 36 weeks of pregnancy is 85% more effective at preventing whooping cough in babies younger than 2 months old. Blood tests cannot tell if you need a whooping cough vaccine There are no blood tests that can tell you if you have enough antibodies in your body to protect yourself or your baby against whooping cough. Even if you have been sick with whooping cough in the past or previously received the vaccine, you still should get the vaccine during each pregnancy. Breastfeeding may pass some protective antibodies onto your baby By breastfeeding, you may pass some antibodies you have made in response to the vaccine to your baby. When you get a whooping cough vaccine during your pregnancy, you will have antibodies in your breast milk that you can share with your baby as soon as your milk comes in. However, your baby will not get protective antibodies immediately if you wait to get the whooping cough vaccine until after delivering your baby. This is because it takes about 2 weeks for your body to create antibodies. Learn more about the health benefits of breastfeeding.

## 2024-03-23 NOTE — Progress Notes (Signed)
   PRENATAL VISIT NOTE  Subjective:  Lindsay [name] is a 23 y.o. G2P1001 at [redacted]w[redacted]d being seen today for ongoing prenatal care.  She is currently monitored for the following issues for this low-risk pregnancy and has Supervision of other normal pregnancy, antepartum; Pelvic pain affecting pregnancy; UTI in pregnancy, antepartum, first trimester; BMI 32.0-32.9,adult; and Obesity in pregnancy on their problem list.  Patient reports pelvic pain that has continued despite other interventions.  Contractions: Irritability.  .  Movement: Present. Denies leaking of fluid.   The following portions of the patient's history were reviewed and updated as appropriate: allergies, current medications, past family history, past medical history, past social history, past surgical history and problem list.   Objective:    Vitals:   03/23/24 0904  BP: 108/72  Pulse: 86  Weight: 205 lb (93 kg)    Fetal Status:  Fetal Heart Rate (bpm): 134 Fundal Height: 29 cm Movement: Present    General: Alert, oriented and cooperative. Patient is in no acute distress.  Skin: Skin is warm and dry. No rash noted.   Cardiovascular: Normal heart rate noted  Respiratory: Normal respiratory effort, no problems with respiration noted  Abdomen: Soft, gravid, appropriate for gestational age.  Pain/Pressure: Present     Pelvic: Cervical exam deferred        Extremities: Normal range of motion.  Edema: None  Mental Status: Normal mood and affect. Normal behavior. Normal judgment and thought content.   Assessment and Plan:  Pregnancy: G2P1001 at [redacted]w[redacted]d 1. Pelvic pain affecting pregnancy in third trimester, antepartum Has tried support belt and other interventions as discussed.  Tylenol  does not work.  Flexeril  prescribed for now, will follow up response. - cyclobenzaprine  (FLEXERIL ) 10 MG tablet; Take 1 tablet (10 mg total) by mouth 3 (three) times daily as needed for muscle spasms.  Dispense: 30 tablet; Refill: 2  2. [redacted] weeks  gestation of pregnancy 3. Supervision of other normal pregnancy, antepartum (Primary) Third trimester labs today, will follow up results and manage accordingly. Discussed Tdap, she will consider this. Considering IPP Paragard, discussed risks/benefits of this. Third trimester expectations reviewed and all questions answered. - Glucose Tolerance, 2 Hours w/1 Hour - CBC - RPR - HIV Antibody (routine testing w rflx) - Ferritin Preterm labor symptoms and general obstetric precautions including but not limited to vaginal bleeding, contractions, leaking of fluid and fetal movement were reviewed in detail with the patient. Please refer to After Visit Summary for other counseling recommendations.   Return in about 2 weeks (around 04/06/2024) for OFFICE OB VISIT (MD or APP).  Future Appointments  Date Time Provider Department Center  04/06/2024  2:50 PM Emilio Delilah CHRISTELLA EDDY [facility]  04/20/2024  3:50 PM Izell Harari, MD [facility]    Gloris Hugger, MD

## 2024-03-24 ENCOUNTER — Ambulatory Visit: Payer: Self-pay | Admitting: Obstetrics & Gynecology

## 2024-03-24 DIAGNOSIS — R790 Abnormal level of blood mineral: Secondary | ICD-10-CM | POA: Insufficient documentation

## 2024-03-24 DIAGNOSIS — Z3480 Encounter for supervision of other normal pregnancy, unspecified trimester: Secondary | ICD-10-CM

## 2024-03-24 LAB — CBC
Hematocrit: 36.4 % (ref 34.0–46.6)
Hemoglobin: 12 g/dL (ref 11.1–15.9)
MCH: 31.5 pg (ref 26.6–33.0)
MCHC: 33 g/dL (ref 31.5–35.7)
MCV: 96 fL (ref 79–97)
Platelets: 248 x10E3/uL (ref 150–450)
RBC: 3.81 x10E6/uL (ref 3.77–5.28)
RDW: 12.2 % (ref 11.7–15.4)
WBC: 11.5 x10E3/uL — ABNORMAL HIGH (ref 3.4–10.8)

## 2024-03-24 LAB — HIV ANTIBODY (ROUTINE TESTING W REFLEX): HIV Screen 4th Generation wRfx: NONREACTIVE

## 2024-03-24 LAB — RPR: RPR Ser Ql: NONREACTIVE

## 2024-03-24 LAB — GLUCOSE TOLERANCE, 2 HOURS W/ 1HR
Glucose, 1 hour: 164 mg/dL (ref 70–179)
Glucose, 2 hour: 119 mg/dL (ref 70–152)
Glucose, Fasting: 81 mg/dL (ref 70–91)

## 2024-03-24 LAB — FERRITIN: Ferritin: 8 ng/mL — ABNORMAL LOW (ref 15–150)

## 2024-03-24 MED ORDER — FERRIC MALTOL 30 MG PO CAPS: 1.0000 | ORAL_CAPSULE | Freq: Two times a day (BID) | ORAL | 2 refills | Status: DC

## 2024-04-06 ENCOUNTER — Encounter: Payer: Self-pay | Admitting: Certified Nurse Midwife

## 2024-04-06 ENCOUNTER — Ambulatory Visit (INDEPENDENT_AMBULATORY_CARE_PROVIDER_SITE_OTHER): Admitting: Certified Nurse Midwife

## 2024-04-06 VITALS — BP 111/71 | HR 97 | Wt 209.0 lb

## 2024-04-06 DIAGNOSIS — Z3A30 30 weeks gestation of pregnancy: Secondary | ICD-10-CM

## 2024-04-06 DIAGNOSIS — Z3493 Encounter for supervision of normal pregnancy, unspecified, third trimester: Secondary | ICD-10-CM | POA: Diagnosis not present

## 2024-04-06 MED ORDER — PRENATAL 27-0.8 MG PO TABS: 1.0000 | ORAL_TABLET | Freq: Every day | ORAL | 3 refills | Status: AC

## 2024-04-06 NOTE — Progress Notes (Signed)
   PRENATAL VISIT NOTE  Subjective:  Lindsay [name] is a 23 y.o. G2P1001 at [redacted]w[redacted]d being seen today for ongoing prenatal care.  She is currently monitored for the following issues for this low-risk pregnancy and has Supervision of other normal pregnancy, antepartum; Pelvic pain affecting pregnancy; UTI in pregnancy, antepartum, first trimester; BMI 32.0-32.9,adult; Obesity in pregnancy; and Low serum ferritin level on their problem list.  Patient reports no complaints.  Contractions: Irritability. Vag. Bleeding: None.  Movement: Present. Denies leaking of fluid.   The following portions of the patient's history were reviewed and updated as appropriate: allergies, current medications, past family history, past medical history, past social history, past surgical history and problem list.   Objective:    Vitals:   04/06/24 1500  BP: 111/71  Pulse: 97  Weight: 209 lb (94.8 kg)    Fetal Status:  Fetal Heart Rate (bpm): 152 Fundal Height: 30 cm Movement: Present    General: Alert, oriented and cooperative. Patient is in no acute distress.  Skin: Skin is warm and dry. No rash noted.   Cardiovascular: Normal heart rate noted  Respiratory: Normal respiratory effort, no problems with respiration noted  Abdomen: Soft, gravid, appropriate for gestational age.  Pain/Pressure: Present     Pelvic: Cervical exam deferred        Extremities: Normal range of motion.  Edema: Trace  Mental Status: Normal mood and affect. Normal behavior. Normal judgment and thought content.   Assessment and Plan:  Pregnancy: G2P1001 at [redacted]w[redacted]d 1. [redacted] weeks gestation of pregnancy (Primary) - Patient doing well.  - Reports vigorous and frequent fetal movement   2. Encounter for supervision of low-risk pregnancy in third trimester - Reviewed 3rd trimester expectations. Discussed fetal positioning kick counts for 3rd trimester.   Preterm labor symptoms and general obstetric precautions including but not limited to  vaginal bleeding, contractions, leaking of fluid and fetal movement were reviewed in detail with the patient. Please refer to After Visit Summary for other counseling recommendations.   Return in about 2 weeks (around 04/20/2024) for LOB.  Future Appointments  Date Time Provider Department Center  04/20/2024  3:50 PM Izell Harari, MD [facility]  05/04/2024  2:30 PM Emilio Delilah HERO, CNM [facility]  05/18/2024  2:30 PM Anyanwu, Gloris LABOR, MD [facility]    Cressie Betzler Erven) Emilio, MSN, CNM  Center for [facility]  04/06/2024 3:38 PM

## 2024-04-06 NOTE — Progress Notes (Signed)
 ROB   Needs refill on PNV.  Tdap: Per pt NV   CC: Increased Pressure declines exam discomfort is bearable.

## 2024-04-20 ENCOUNTER — Ambulatory Visit: Admitting: Obstetrics and Gynecology

## 2024-04-20 VITALS — BP 110/72 | HR 99 | Wt 211.0 lb

## 2024-04-20 DIAGNOSIS — O2341 Unspecified infection of urinary tract in pregnancy, first trimester: Secondary | ICD-10-CM

## 2024-04-20 DIAGNOSIS — Z3A32 32 weeks gestation of pregnancy: Secondary | ICD-10-CM | POA: Diagnosis not present

## 2024-04-20 DIAGNOSIS — O99210 Obesity complicating pregnancy, unspecified trimester: Secondary | ICD-10-CM

## 2024-04-20 DIAGNOSIS — R790 Abnormal level of blood mineral: Secondary | ICD-10-CM

## 2024-04-20 DIAGNOSIS — K0889 Other specified disorders of teeth and supporting structures: Secondary | ICD-10-CM | POA: Diagnosis not present

## 2024-04-20 NOTE — Progress Notes (Signed)
 ROB  T-Dap next visit pt not feeling well today. CC: Tooth pain, pressure note pain in left leg recently.

## 2024-04-25 ENCOUNTER — Encounter: Payer: Self-pay | Admitting: Obstetrics and Gynecology

## 2024-04-25 NOTE — Progress Notes (Signed)
   PRENATAL VISIT NOTE  Subjective:  Lindsay [name] is a 23 y.o. G2P1001 at [redacted]w[redacted]d being seen today for ongoing prenatal care.  She is currently monitored for the following issues for this low-risk pregnancy and has Supervision of other normal pregnancy, antepartum; Pelvic pain affecting pregnancy; UTI in pregnancy, antepartum, first trimester; BMI 32.0-32.9,adult; Obesity in pregnancy; and Low serum ferritin level on their problem list.  Patient reports tooth pain and dentist is recommending to remove both teeth.  Contractions: Not present. Vag. Bleeding: None.  Movement: Present. Denies leaking of fluid.   The following portions of the patient's history were reviewed and updated as appropriate: allergies, current medications, past family history, past medical history, past social history, past surgical history and problem list.   Objective:    Vitals:   04/20/24 1416  BP: 110/72  Pulse: 99  Weight: 211 lb (95.7 kg)    Fetal Status:  Fetal Heart Rate (bpm): 152 Fundal Height: 32 cm Movement: Present    General: Alert, oriented and cooperative. Patient is in no acute distress.  Skin: Skin is warm and dry. No rash noted.   Cardiovascular: Normal heart rate noted  Respiratory: Normal respiratory effort, no problems with respiration noted  Abdomen: Soft, gravid, appropriate for gestational age.  Pain/Pressure: Present     Pelvic: Cervical exam deferred        Extremities: Normal range of motion.  Edema: Trace  Mental Status: Normal mood and affect. Normal behavior. Normal judgment and thought content.   Assessment and Plan:  Pregnancy: G2P1001 at [redacted]w[redacted]d 1. Tooth pain (Primary) No obvious signs of infection on the gumline but likely cavities noted at the above mentioned two teeth. I told her re: risk of dental infection in terms of issues with the baby and with tooth removal. I told her that dental infection can put her at risk of preterm labor due to potential for bacteria in the  bloodstream; she states they've already tried amoxicillin . I also told her risk of transient bacteremia with tooth removal, such as preterm labor. I said that I'd have to defer to her dentist if he or she doesn't feel that they can tide her over until her delivery. I told her that local medications for tooth removal would be fine if removal was warranted and to have the dentist call our office with any questions or concerns. I also recommended a 2nd opinion, as well.   2. [redacted] weeks gestation of pregnancy  3. UTI in pregnancy, antepartum, first trimester Test of cure next visit  4. Low serum ferritin level Continue po iron  5. Obesity in pregnancy  Preterm labor symptoms and general obstetric precautions including but not limited to vaginal bleeding, contractions, leaking of fluid and fetal movement were reviewed in detail with the patient. Please refer to After Visit Summary for other counseling recommendations.   Return in about 3 weeks (around 05/11/2024) for low risk ob, in person, md or app.  Future Appointments  Date Time Provider Department Center  05/04/2024  2:30 PM Emilio Delilah CHRISTELLA EDDY [facility]  05/18/2024  2:30 PM Anyanwu, Gloris LABOR, MD [facility]    Bebe Furry, MD

## 2024-05-04 ENCOUNTER — Ambulatory Visit: Admitting: Certified Nurse Midwife

## 2024-05-04 VITALS — BP 98/67 | HR 93 | Wt 211.0 lb

## 2024-05-04 DIAGNOSIS — Z23 Encounter for immunization: Secondary | ICD-10-CM | POA: Diagnosis not present

## 2024-05-04 DIAGNOSIS — R8279 Other abnormal findings on microbiological examination of urine: Secondary | ICD-10-CM | POA: Diagnosis not present

## 2024-05-04 DIAGNOSIS — Z3493 Encounter for supervision of normal pregnancy, unspecified, third trimester: Secondary | ICD-10-CM

## 2024-05-04 DIAGNOSIS — Z3A34 34 weeks gestation of pregnancy: Secondary | ICD-10-CM

## 2024-05-04 DIAGNOSIS — Z3483 Encounter for supervision of other normal pregnancy, third trimester: Secondary | ICD-10-CM

## 2024-05-06 NOTE — Progress Notes (Signed)
   PRENATAL VISIT NOTE  Subjective:  Lindsay [name] is a 23 y.o. G2P1001 at [redacted]w[redacted]d being seen today for ongoing prenatal care.  She is currently monitored for the following issues for this low-risk pregnancy and has Supervision of other normal pregnancy, antepartum; Tooth pain; UTI in pregnancy, antepartum, first trimester; BMI 32.0-32.9,adult; Obesity in pregnancy; and Low serum ferritin level on their problem list.  Patient reports no complaints.  Contractions: Irritability. Vag. Bleeding: None.  Movement: Present. Denies leaking of fluid.   The following portions of the patient's history were reviewed and updated as appropriate: allergies, current medications, past family history, past medical history, past social history, past surgical history and problem list.   Objective:    Vitals:   05/04/24 1500  BP: 98/67  Pulse: 93  Weight: 211 lb (95.7 kg)    Fetal Status:  Fetal Heart Rate (bpm): 153   Movement: Present    General: Alert, oriented and cooperative. Patient is in no acute distress.  Skin: Skin is warm and dry. No rash noted.   Cardiovascular: Normal heart rate noted  Respiratory: Normal respiratory effort, no problems with respiration noted  Abdomen: Soft, gravid, appropriate for gestational age.  Pain/Pressure: Present     Pelvic: Cervical exam deferred        Extremities: Normal range of motion.     Mental Status: Normal mood and affect. Normal behavior. Normal judgment and thought content.   Assessment and Plan:  Pregnancy: G2P1001 at [redacted]w[redacted]d 1. Encounter for supervision of low-risk pregnancy in third trimester (Primary) - Patient doing well.  - Reports vigorous and frequent fetal movement   2. [redacted] weeks gestation of pregnancy - Leopolds vertex position today.  - Was previously transverse.  - Briefly discussed RSV vaccine, but patient would like to wait at this time  - GBS at next visit.   3. Positive urine culture - Urine culture for test of cure to be collected  today.   4. Need for diphtheria-tetanus-pertussis (Tdap) vaccine - Tdap vaccine greater than or equal to 7yo IM  Preterm labor symptoms and general obstetric precautions including but not limited to vaginal bleeding, contractions, leaking of fluid and fetal movement were reviewed in detail with the patient. Please refer to After Visit Summary for other counseling recommendations.   Return in about 2 weeks (around 05/18/2024) for LOB w GBS.  Future Appointments  Date Time Provider Department Center  05/18/2024  2:30 PM Herchel Gloris LABOR, MD [facility]  05/25/2024  2:30 PM Letha Renshaw, CNM [facility]  06/01/2024  2:30 PM Emilio Delilah HERO, CNM [facility]  06/08/2024 11:15 AM Emilio Delilah HERO, CNM [facility]    Rob Mciver Erven) Emilio, MSN, CNM  Center for [facility]  05/06/2024 1:37 AM

## 2024-05-08 ENCOUNTER — Encounter: Payer: Self-pay | Admitting: Certified Nurse Midwife

## 2024-05-18 ENCOUNTER — Other Ambulatory Visit (HOSPITAL_COMMUNITY)
Admission: RE | Admit: 2024-05-18 | Discharge: 2024-05-18 | Disposition: A | Discharge status: Home / Self Care | Source: Ambulatory Visit | Attending: Obstetrics & Gynecology | Admitting: Obstetrics & Gynecology

## 2024-05-18 ENCOUNTER — Other Ambulatory Visit: Payer: Self-pay

## 2024-05-18 ENCOUNTER — Ambulatory Visit (INDEPENDENT_AMBULATORY_CARE_PROVIDER_SITE_OTHER): Admitting: Obstetrics & Gynecology

## 2024-05-18 VITALS — BP 120/74 | HR 103 | Wt 215.4 lb

## 2024-05-18 DIAGNOSIS — Z3A36 36 weeks gestation of pregnancy: Secondary | ICD-10-CM | POA: Diagnosis not present

## 2024-05-18 DIAGNOSIS — Z3689 Encounter for other specified antenatal screening: Secondary | ICD-10-CM

## 2024-05-18 DIAGNOSIS — O322XX0 Maternal care for transverse and oblique lie, not applicable or unspecified: Secondary | ICD-10-CM | POA: Diagnosis not present

## 2024-05-18 DIAGNOSIS — K0889 Other specified disorders of teeth and supporting structures: Secondary | ICD-10-CM

## 2024-05-18 DIAGNOSIS — Z3493 Encounter for supervision of normal pregnancy, unspecified, third trimester: Secondary | ICD-10-CM | POA: Insufficient documentation

## 2024-05-18 DIAGNOSIS — O2341 Unspecified infection of urinary tract in pregnancy, first trimester: Secondary | ICD-10-CM

## 2024-05-18 MED ORDER — BENZOCAINE 10 % MT GEL: 1.0000 | OROMUCOSAL | 0 refills | Status: DC | PRN

## 2024-05-18 NOTE — Progress Notes (Signed)
Pt c/o tooth pain.

## 2024-05-18 NOTE — Progress Notes (Signed)
 PRENATAL VISIT NOTE  Subjective:  Lindsay [name] is a 23 y.o. G2P1001 at [redacted]w[redacted]d being seen today for ongoing prenatal care.  She is currently monitored for the following issues for this low-risk pregnancy and has Supervision of other normal pregnancy, antepartum; Tooth pain; UTI in pregnancy, antepartum, first trimester; BMI 32.0-32.9,adult; Obesity in pregnancy; and Low serum ferritin level on their problem list.  Patient reports continued tooth pain, wants to be induced earlier so she can get this pulled.  Contractions: Irritability. Vag. Bleeding: None.  Movement: Present. Denies leaking of fluid.   The following portions of the patient's history were reviewed and updated as appropriate: allergies, current medications, past family history, past medical history, past social history, past surgical history and problem list.   Objective:    Vitals:   05/18/24 1453  BP: 120/74  Pulse: (!) 103  Weight: 215 lb 6.4 oz (97.7 kg)    Fetal Status:  Fetal Heart Rate (bpm): 139 Fundal Height: 37 cm Movement: Present Presentation: Transverse (Head to maternal right on ultrasound)  General: Alert, oriented and cooperative. Patient is in no acute distress.  Skin: Skin is warm and dry. No rash noted.   Cardiovascular: Normal heart rate noted  Respiratory: Normal respiratory effort, no problems with respiration noted  Abdomen: Soft, gravid, appropriate for gestational age.  Pain/Pressure: Present     Pelvic: Cervical exam performed in the presence of a chaperone Dilation: Closed Effacement (%): Thick Station: -3, cultures obtained  Extremities: Normal range of motion.  Edema: None  Mental Status: Normal mood and affect. Normal behavior. Normal judgment and thought content.   Assessment and Plan:  Pregnancy: G2P1001 at [redacted]w[redacted]d 1. Tooth pain Orajel prescribed.  Patient wanted oral narcotics, she was told this was not indicated at this point. Patient was advised to visit dentist for further  evaluation, she does not want to do any procedures as she is worried about infection spreading into her blood and making her and baby sick.  She was told that having a tooth abscess carries the same risk of spreading to the blood too, and that antibiotics can be given after any tooth procedure to mitigate this risk.  She asked to be induced at 37 weeks, I told her that the earliest IOL would be at 39 weeks unless told otherwise by MFM or for any other indication. Recommended MFM consultation, this was ordered. Will follow up recommendations - benzocaine  (ORAJEL) 10 % mucosal gel; Use as directed 1 Application in the mouth or throat as needed for mouth pain.  Dispense: 7 g; Refill: 0 - AMB referral to maternal fetal medicine  2. UTI in pregnancy, antepartum, first trimester Test of cure done today, will follow up results and manage accordingly. - Culture, OB Urine 1  3. Transverse lie of fetus, single or unspecified fetus Seen on ultrasound today.  If fetus still non-cephalic next week, will discuss ECV vs Cesarean delivery  4. [redacted] weeks gestation of pregnancy 5. Encounter for supervision of low-risk pregnancy in third trimester (Primary) Pelvic cultures done, will follow up results and manage accordingly. - Strep Gp B NAA - GC/Chlamydia probe amp ([place])not at [facility]  Preterm labor symptoms and general obstetric precautions including but not limited to vaginal bleeding, contractions, leaking of fluid and fetal movement were reviewed in detail with the patient. Please refer to After Visit Summary for other counseling recommendations.   No follow-ups on file.  Future Appointments  Date Time Provider Department Center  05/25/2024  2:30  PM Letha Renshaw, CNM [facility]  06/01/2024  2:30 PM Emilio Delilah HERO, CNM [facility]  06/08/2024 11:15 AM Emilio Delilah HERO, CNM [facility]    Gloris Hugger, MD

## 2024-05-18 NOTE — Progress Notes (Signed)
 Patient informed that the ultrasound is considered a limited obstetric ultrasound and is not intended to be a complete ultrasound exam.  Patient also informed that the ultrasound is not being completed with the intent of assessing for fetal or placental anomalies or any pelvic abnormalities. Explained that the purpose of today's ultrasound is to assess for fetal presentation.  Patient acknowledges the purpose of the exam and the limitations of the study.        Wanda Buckles, RN

## 2024-05-19 LAB — URINE CULTURE, OB REFLEX

## 2024-05-19 LAB — CULTURE, OB URINE

## 2024-05-20 ENCOUNTER — Ambulatory Visit: Payer: Self-pay | Admitting: Obstetrics & Gynecology

## 2024-05-20 DIAGNOSIS — O99820 Streptococcus B carrier state complicating pregnancy: Secondary | ICD-10-CM | POA: Insufficient documentation

## 2024-05-20 DIAGNOSIS — Z3480 Encounter for supervision of other normal pregnancy, unspecified trimester: Secondary | ICD-10-CM

## 2024-05-20 LAB — GC/CHLAMYDIA PROBE AMP (~~LOC~~) NOT AT ARMC
Chlamydia: NEGATIVE
Comment: NEGATIVE
Comment: NORMAL
Neisseria Gonorrhea: NEGATIVE

## 2024-05-20 LAB — STREP GP B NAA: Strep Gp B NAA: POSITIVE — AB

## 2024-05-24 ENCOUNTER — Other Ambulatory Visit: Payer: Self-pay | Admitting: *Deleted

## 2024-05-24 DIAGNOSIS — O99213 Obesity complicating pregnancy, third trimester: Secondary | ICD-10-CM

## 2024-05-25 ENCOUNTER — Other Ambulatory Visit (INDEPENDENT_AMBULATORY_CARE_PROVIDER_SITE_OTHER): Payer: Self-pay

## 2024-05-25 ENCOUNTER — Ambulatory Visit: Admitting: Obstetrics and Gynecology

## 2024-05-25 VITALS — BP 121/75 | HR 106 | Wt 219.0 lb

## 2024-05-25 DIAGNOSIS — O322XX0 Maternal care for transverse and oblique lie, not applicable or unspecified: Secondary | ICD-10-CM | POA: Diagnosis not present

## 2024-05-25 DIAGNOSIS — O99820 Streptococcus B carrier state complicating pregnancy: Secondary | ICD-10-CM | POA: Diagnosis not present

## 2024-05-25 DIAGNOSIS — O99210 Obesity complicating pregnancy, unspecified trimester: Secondary | ICD-10-CM | POA: Diagnosis not present

## 2024-05-25 DIAGNOSIS — Z3480 Encounter for supervision of other normal pregnancy, unspecified trimester: Secondary | ICD-10-CM

## 2024-05-25 DIAGNOSIS — Z3A37 37 weeks gestation of pregnancy: Secondary | ICD-10-CM

## 2024-05-25 MED ORDER — CYCLOBENZAPRINE HCL 10 MG PO TABS: 10.0000 mg | ORAL_TABLET | Freq: Three times a day (TID) | ORAL | 1 refills | Status: DC | PRN

## 2024-05-25 NOTE — Progress Notes (Unsigned)
 Baby Vertex today via US    Desires cervical check

## 2024-05-25 NOTE — Patient Instructions (Signed)
 Signs and Symptoms of Labor Labor is the body's natural process of moving the baby and the placenta out of the uterus. The process of labor usually starts when the baby is full-term, between 74 and 41 weeks of pregnancy. Signs and symptoms that you are close to going into labor As your body prepares for labor and the birth of your baby, you may notice the following symptoms in the weeks and days before true labor starts: Passing a small amount of thick, bloody mucus from your vagina. This is called normal bloody show or losing your mucus plug. This may happen more than a week before labor begins, or right before labor begins, as the opening of the cervix starts to widen (dilate). For some women, the entire mucus plug passes at once. For others, pieces of the mucus plug may gradually pass over several days. Your baby moving (dropping) lower in your pelvis to get into position for birth (lightening). When this happens, you may feel more pressure on your bladder and pelvic bone and less pressure on your ribs. This may make it easier to breathe. It may also cause you to need to urinate more often and have problems with bowel movements. Having "practice contractions," also called Braxton Hicks contractions or false labor. These occur at irregular (unevenly spaced) intervals that are more than 10 minutes apart. False labor contractions are common after exercise or sexual activity. They will stop if you change position, rest, or drink fluids. These contractions are usually mild and do not get stronger over time. They may feel like: A backache or back pain. Mild cramps, similar to menstrual cramps. Tightening or pressure in your abdomen. Other early symptoms include: Nausea or loss of appetite. Diarrhea. Having a sudden burst of energy, or feeling very tired. Mood changes. Having trouble sleeping. Signs and symptoms that labor has begun Signs that you are in labor may include: Having contractions that come  at regular (evenly spaced) intervals and increase in intensity. This may feel like more intense tightening or pressure in your abdomen that moves to your back. Contractions may also feel like rhythmic pain in your upper thighs or back that comes and goes at regular intervals. If you are delivering for the first time, this change in intensity of contractions often occurs at a more gradual pace. If you have given birth before, you may notice a more rapid progression of contraction changes. Feeling pressure in the vaginal area. Your water breaking (rupture of membranes). This is when the sac of fluid that surrounds your baby breaks. Fluid leaking from your vagina may be clear or blood-tinged. Labor usually starts within 24 hours of your water breaking, but it may take longer to begin. Some people may feel a sudden gush of fluid; others may notice repeatedly damp underwear. Follow these instructions at home:  When labor starts, or if your water breaks, call your health care provider or nurse care line. Based on your situation, they will determine when you should go in for an exam. During early labor, you may be able to rest and manage symptoms at home. Some strategies to try at home include: Breathing and relaxation techniques. Taking a warm bath or shower. Listening to music. Using a heating pad on the lower back for pain. If directed, apply heat to the area as often as told by your health care provider. Use the heat source that your health care provider recommends, such as a moist heat pack or a heating pad. Place a  towel between your skin and the heat source. Leave the heat on for 20-30 minutes. Remove the heat if your skin turns bright red. This is especially important if you are unable to feel pain, heat, or cold. You have a greater risk of getting burned. Contact a health care provider if: Your labor has started. Your water breaks. You have nausea, vomiting, or diarrhea. Get help right away  if: You have painful, regular contractions that are 5 minutes apart or less. Labor starts before you are [redacted] weeks along in your pregnancy. You have a fever. You have bright red blood coming from your vagina. You do not feel your baby moving. You have a severe headache with or without vision problems. You have chest pain or shortness of breath. These symptoms may represent a serious problem that is an emergency. Do not wait to see if the symptoms will go away. Get medical help right away. Call your local emergency services (911 in the [place] Do not drive yourself to the hospital. Summary Labor is your body's natural process of moving your baby and the placenta out of your uterus. The process of labor usually starts when your baby is full-term, between 25 and 40 weeks of pregnancy. When labor starts, or if your water breaks, call your health care provider or nurse care line. Based on your situation, they will determine when you should go in for an exam. This information is not intended to replace advice given to you by your health care provider. Make sure you discuss any questions you have with your health care provider. Document Revised: 11/27/2020 Document Reviewed: 11/27/2020 Elsevier Patient Education  2024 [employer].

## 2024-05-26 NOTE — Progress Notes (Signed)
   LOW-RISK PREGNANCY OFFICE VISIT Patient name: Lindsay [name] MRN [id number]  Date of birth: 2001/01/31 Chief Complaint:   Routine Prenatal Visit  History of Present Illness:   Lindsay [name] is a 23 y.o. G32P1001 female at [redacted]w[redacted]d with an Estimated Date of Delivery: 06/13/24 being seen today for ongoing management of a low-risk pregnancy.  Today she reports backache. Contractions: Not present. Vag. Bleeding: None.  Movement: Present. denies leaking of fluid. Review of Systems:   Pertinent items are noted in HPI Denies abnormal vaginal discharge w/ itching/odor/irritation, headaches, visual changes, shortness of breath, chest pain, abdominal pain, severe nausea/vomiting, or problems with urination or bowel movements unless otherwise stated above. Pertinent History Reviewed:  Reviewed past medical,surgical, social, obstetrical and family history.  Reviewed problem list, medications and allergies. Physical Assessment:   Vitals:   05/25/24 1503  BP: 121/75  Pulse: (!) 106  Weight: 219 lb (99.3 kg)  Body mass index is 36.44 kg/m.        Physical Examination:   General appearance: Well appearing, and in no distress  Mental status: Alert, oriented to person, place, and time  Skin: Warm & dry  Cardiovascular: Normal heart rate noted  Respiratory: Normal respiratory effort, no distress  Abdomen: Soft, gravid, nontender  Pelvic: Cervical exam performed  Dilation: Closed Effacement (%): Thick Station: Ballotable  Extremities:    Fetal Status: Fetal Heart Rate (bpm): 152 Fundal Height: 39 cm Movement: Present Presentation: Vertex (by U/S and SVE)  No results found for this or any previous visit (from the past 24 hours).  Assessment & Plan:  1) Low-risk pregnancy G2P1001 at [redacted]w[redacted]d with an Estimated Date of Delivery: 06/13/24   2) Supervision of other normal pregnancy, antepartum (Primary) - US  OB Limited; Future - Prescription for: cyclobenzaprine  (FLEXERIL ) 10 MG tablet; Take 1 tablet (10  mg total) by mouth every 8 (eight) hours as needed for muscle spasms.  Dispense: 30 tablet; Refill: 1  3) Transverse lie of fetus, single or unspecified fetus - Vertex today  4) Obesity in pregnancy  5) Group B Streptococcus carrier, +RV culture, currently pregnant - Will need abx in labor  6) [redacted] weeks gestation of pregnancy     Meds:  Meds ordered this encounter  Medications   cyclobenzaprine  (FLEXERIL ) 10 MG tablet    Sig: Take 1 tablet (10 mg total) by mouth every 8 (eight) hours as needed for muscle spasms.    Dispense:  30 tablet    Refill:  1    Supervising Provider:   PRATT, TANYA S [[id number]]   Labs/procedures today: U/S and cervical exam  Plan:  Continue routine obstetrical care   Reviewed: Term labor symptoms and general obstetric precautions including but not limited to vaginal bleeding, contractions, leaking of fluid and fetal movement were reviewed in detail with the patient.  All questions were answered. Has home bp cuff. Check bp weekly, let us  know if >140/90.   Follow-up: Return in about 1 week (around 06/01/2024) for Return OB visit.  Orders Placed This Encounter  Procedures   US  OB Limited   Ala Cart MSN, CNM 05/25/2024 3:29 PM

## 2024-06-01 ENCOUNTER — Ambulatory Visit (INDEPENDENT_AMBULATORY_CARE_PROVIDER_SITE_OTHER): Admitting: Certified Nurse Midwife

## 2024-06-01 VITALS — BP 117/77 | HR 98 | Wt 216.0 lb

## 2024-06-01 DIAGNOSIS — Z3A38 38 weeks gestation of pregnancy: Secondary | ICD-10-CM | POA: Diagnosis not present

## 2024-06-01 DIAGNOSIS — O26893 Other specified pregnancy related conditions, third trimester: Secondary | ICD-10-CM | POA: Diagnosis not present

## 2024-06-01 DIAGNOSIS — N898 Other specified noninflammatory disorders of vagina: Secondary | ICD-10-CM

## 2024-06-01 DIAGNOSIS — Z3493 Encounter for supervision of normal pregnancy, unspecified, third trimester: Secondary | ICD-10-CM

## 2024-06-01 DIAGNOSIS — B951 Streptococcus, group B, as the cause of diseases classified elsewhere: Secondary | ICD-10-CM

## 2024-06-01 NOTE — Progress Notes (Addendum)
 PRENATAL VISIT NOTE  Subjective:  Lindsay [name] is a 23 y.o. G2P1001 at [redacted]w[redacted]d being seen today for ongoing prenatal care.  She is currently monitored for the following issues for this low-risk pregnancy and has Supervision of other normal pregnancy, antepartum; Tooth pain; UTI in pregnancy, antepartum, first trimester; BMI 32.0-32.9,adult; Obesity in pregnancy; Low serum ferritin level; and Group B Streptococcus carrier, +RV culture, currently pregnant on their problem list.  Patient reports intermittent contractions, increased pelvic pressure, back pain, and leaking clear fluid and mucus. She states that she started leaking fluid last week, which has increased this week where she has had to change her underwear on two separate occasions.  Contractions: Irritability. Vag. Bleeding: None.  Movement: Present. Denies leaking of fluid.   The following portions of the patient's history were reviewed and updated as appropriate: allergies, current medications, past family history, past medical history, past social history, past surgical history and problem list.   Objective:   Vitals:   06/01/24 1432  BP: 117/77  Pulse: 98  Weight: 98 kg    Fetal Status:  Fetal Heart Rate (bpm): 134 Fundal Height: 38 cm Movement: Present    General: Alert, oriented and cooperative. Patient is in no acute distress.  Skin: Skin is warm and dry. No rash noted.   Cardiovascular: Normal heart rate noted  Respiratory: Normal respiratory effort, no problems with respiration noted  Abdomen: Soft, gravid, appropriate for gestational age.  Pain/Pressure: Present     Pelvic: Cervical exam performed in the presence of a chaperone      . Presumably closed. Unable to reach cervix  Extremities: Normal range of motion.  Edema: Moderate pitting, indentation subsides rapidly  Mental Status: Normal mood and affect. Normal behavior. Normal judgment and thought content.      05/18/2024    2:58 PM 03/23/2024    8:58 AM  12/02/2023   12:18 PM  Depression screen PHQ 2/9  Decreased Interest 0 2 1  Down, Depressed, Hopeless  0 0  PHQ - 2 Score 0 2 1  Altered sleeping 0 3 0  Tired, decreased energy 0 2 1  Change in appetite 0 0 0  Feeling bad or failure about yourself  0 0 0  Trouble concentrating 0 0 0  Moving slowly or fidgety/restless 0 0 0  Suicidal thoughts 0 0 0  PHQ-9 Score 0 7 2  Difficult doing work/chores   Not difficult at all        05/18/2024    2:57 PM 03/23/2024    8:58 AM 12/02/2023   12:18 PM  GAD 7 : Generalized Anxiety Score  Nervous, Anxious, on Edge 1 0 0  Control/stop worrying 0 0 0  Worry too much - different things 1 0 0  Trouble relaxing 1 0 0  Restless 1 0 0  Easily annoyed or irritable 2 1 1   Afraid - awful might happen 0 0 0  Total GAD 7 Score 6 1 1   Anxiety Difficulty   Not difficult at all    Assessment and Plan:  Pregnancy: G2P1001 at [redacted]w[redacted]d 1. Encounter for supervision of low-risk pregnancy in third trimester (Primary) - Doing well, feeling regular and vigorous fetal movement  -Follow up 1 week for ROB -Patient put on the elective induction list for 40 weeks  2. [redacted] weeks gestation of pregnancy - Routine OB care   3. Positive GBS test -Discussed need for antibiotics during labor  4. Vaginal discharge during pregnancy in third trimester -  Negative POC Nitrazine test - Nitrazine Test   Term labor symptoms and general obstetric precautions including but not limited to vaginal bleeding, contractions, leaking of fluid and fetal movement were reviewed in detail with the patient. Please refer to After Visit Summary for other counseling recommendations.   Return in about 1 week (around 06/08/2024) for ROB.  Future Appointments  Date Time Provider Department Center  06/08/2024 11:15 AM Emilio Delilah HERO, CNM [facility]    Derrek JINNY Freund, NP Student

## 2024-06-01 NOTE — Progress Notes (Signed)
 ROB  CC: Pain/pressure Irregular ctx's wants cervix check.

## 2024-06-03 ENCOUNTER — Ambulatory Visit

## 2024-06-07 NOTE — Progress Notes (Signed)
 Pt called to come in for elective induction by night shift a little after 5am per night shift report pt declined to come in at this time.

## 2024-06-08 ENCOUNTER — Ambulatory Visit: Admitting: Certified Nurse Midwife

## 2024-06-08 ENCOUNTER — Telehealth (HOSPITAL_COMMUNITY): Payer: Self-pay | Admitting: *Deleted

## 2024-06-08 ENCOUNTER — Encounter (HOSPITAL_COMMUNITY): Payer: Self-pay | Admitting: *Deleted

## 2024-06-08 VITALS — BP 110/73 | HR 93 | Wt 221.1 lb

## 2024-06-08 DIAGNOSIS — Z3A39 39 weeks gestation of pregnancy: Secondary | ICD-10-CM | POA: Diagnosis not present

## 2024-06-08 DIAGNOSIS — B951 Streptococcus, group B, as the cause of diseases classified elsewhere: Secondary | ICD-10-CM

## 2024-06-08 DIAGNOSIS — Z3483 Encounter for supervision of other normal pregnancy, third trimester: Secondary | ICD-10-CM

## 2024-06-08 DIAGNOSIS — Z3493 Encounter for supervision of normal pregnancy, unspecified, third trimester: Secondary | ICD-10-CM

## 2024-06-08 NOTE — Telephone Encounter (Signed)
 Preadmission screen

## 2024-06-08 NOTE — Progress Notes (Signed)
 Lindsay [name]

## 2024-06-08 NOTE — Progress Notes (Signed)
 PRENATAL VISIT NOTE  Subjective:  Lindsay [name] is a 23 y.o. G2P1001 at [redacted]w[redacted]d being seen today for ongoing prenatal care.  She is currently monitored for the following issues for this low-risk pregnancy and has Supervision of other normal pregnancy, antepartum; Tooth pain; UTI in pregnancy, antepartum, first trimester; BMI 32.0-32.9,adult; Obesity in pregnancy; Low serum ferritin level; and Group B Streptococcus carrier, +RV culture, currently pregnant on their problem list.  Patient reports no complaints.  Contractions: Regular. Vag. Bleeding: None.  Movement: Present. Denies leaking of fluid.   The following portions of the patient's history were reviewed and updated as appropriate: allergies, current medications, past family history, past medical history, past social history, past surgical history and problem list.   Objective:   Vitals:   06/08/24 1133  BP: 110/73  Pulse: 93  Weight: 221 lb 2 oz (100.3 kg)    Fetal Status:  Fetal Heart Rate (bpm): 140   Movement: Present    General: Alert, oriented and cooperative. Patient is in no acute distress.  Skin: Skin is warm and dry. No rash noted.   Cardiovascular: Normal heart rate noted  Respiratory: Normal respiratory effort, no problems with respiration noted  Abdomen: Soft, gravid, appropriate for gestational age.  Pain/Pressure: Present     Pelvic: Cervical exam deferred        Extremities: Normal range of motion.     Mental Status: Normal mood and affect. Normal behavior. Normal judgment and thought content.      05/18/2024    2:58 PM 03/23/2024    8:58 AM 12/02/2023   12:18 PM  Depression screen PHQ 2/9  Decreased Interest 0 2 1  Down, Depressed, Hopeless  0 0  PHQ - 2 Score 0 2 1  Altered sleeping 0 3 0  Tired, decreased energy 0 2 1  Change in appetite 0 0 0  Feeling bad or failure about yourself  0 0 0  Trouble concentrating 0 0 0  Moving slowly or fidgety/restless 0 0 0  Suicidal thoughts 0 0 0  PHQ-9 Score 0   7  2   Difficult doing work/chores   Not difficult at all     Data saved with a previous flowsheet row definition        05/18/2024    2:57 PM 03/23/2024    8:58 AM 12/02/2023   12:18 PM  GAD 7 : Generalized Anxiety Score  Nervous, Anxious, on Edge 1 0 0  Control/stop worrying 0 0 0  Worry too much - different things 1 0 0  Trouble relaxing 1 0 0  Restless 1 0 0  Easily annoyed or irritable 2 1 1   Afraid - awful might happen 0 0 0  Total GAD 7 Score 6 1 1   Anxiety Difficulty   Not difficult at all    Assessment and Plan:  Pregnancy: G2P1001 at [redacted]w[redacted]d 1. Encounter for supervision of low-risk pregnancy in third trimester (Primary) - Patient doing well.  - reports frequent and vigorous fetal movement.   2. [redacted] weeks gestation of pregnancy - eIOL scheduled for this weekend.   3. Positive GBS test - Plan for Abx in labor.   Term labor symptoms and general obstetric precautions including but not limited to vaginal bleeding, contractions, leaking of fluid and fetal movement were reviewed in detail with the patient. Please refer to After Visit Summary for other counseling recommendations.   No follow-ups on file.  Future Appointments  Date Time Provider Department Center  06/20/2024  7:15 AM [facility] SCHED ROOM [facility] None    Dilcia Rybarczyk Erven) Emilio, MSN, CNM  [facility]  06/08/2024 12:15 PM

## 2024-06-10 ENCOUNTER — Encounter (HOSPITAL_COMMUNITY): Payer: Self-pay | Admitting: Obstetrics and Gynecology

## 2024-06-10 ENCOUNTER — Inpatient Hospital Stay (HOSPITAL_COMMUNITY): Admitting: Anesthesiology

## 2024-06-10 ENCOUNTER — Inpatient Hospital Stay (HOSPITAL_COMMUNITY)
Admission: AD | Admit: 2024-06-10 | Discharge: 2024-06-12 | DRG: 807 | Disposition: A | Discharge status: Home / Self Care | Attending: Obstetrics and Gynecology | Admitting: Obstetrics and Gynecology

## 2024-06-10 DIAGNOSIS — O99820 Streptococcus B carrier state complicating pregnancy: Secondary | ICD-10-CM

## 2024-06-10 DIAGNOSIS — O26893 Other specified pregnancy related conditions, third trimester: Secondary | ICD-10-CM | POA: Diagnosis not present

## 2024-06-10 DIAGNOSIS — Z3A39 39 weeks gestation of pregnancy: Secondary | ICD-10-CM

## 2024-06-10 DIAGNOSIS — O99824 Streptococcus B carrier state complicating childbirth: Secondary | ICD-10-CM | POA: Diagnosis present

## 2024-06-10 DIAGNOSIS — O9952 Diseases of the respiratory system complicating childbirth: Secondary | ICD-10-CM | POA: Diagnosis not present

## 2024-06-10 DIAGNOSIS — Z3480 Encounter for supervision of other normal pregnancy, unspecified trimester: Secondary | ICD-10-CM

## 2024-06-10 DIAGNOSIS — Z30430 Encounter for insertion of intrauterine contraceptive device: Secondary | ICD-10-CM | POA: Diagnosis not present

## 2024-06-10 DIAGNOSIS — J45909 Unspecified asthma, uncomplicated: Secondary | ICD-10-CM | POA: Diagnosis not present

## 2024-06-10 LAB — CBC
HCT: 38.5 % (ref 36.0–46.0)
Hemoglobin: 13.2 g/dL (ref 12.0–15.0)
MCH: 30.6 pg (ref 26.0–34.0)
MCHC: 34.3 g/dL (ref 30.0–36.0)
MCV: 89.1 fL (ref 80.0–100.0)
Platelets: 246 K/uL (ref 150–400)
RBC: 4.32 MIL/uL (ref 3.87–5.11)
RDW: 14.4 % (ref 11.5–15.5)
WBC: 7.7 K/uL (ref 4.0–10.5)
nRBC: 0 % (ref 0.0–0.2)

## 2024-06-10 LAB — TYPE AND SCREEN
ABO/RH(D): O POS
Antibody Screen: NEGATIVE

## 2024-06-10 MED ORDER — EPHEDRINE 5 MG/ML INJ
10.0000 mg | INTRAVENOUS | Status: DC | PRN
Start: 2024-06-10 — End: 2024-06-10

## 2024-06-10 MED ORDER — IBUPROFEN 600 MG PO TABS
600.0000 mg | ORAL_TABLET | Freq: Four times a day (QID) | ORAL | Status: DC
  Administered 2024-06-11 – 2024-06-12 (×7): 600 mg via ORAL
  Filled 2024-06-10 (×7): qty 1

## 2024-06-10 MED ORDER — OXYTOCIN BOLUS FROM INFUSION
333.0000 mL | Freq: Once | INTRAVENOUS | Status: AC
  Administered 2024-06-10: 333 mL via INTRAVENOUS

## 2024-06-10 MED ORDER — SOD CITRATE-CITRIC ACID 500-334 MG/5ML PO SOLN: 30.0000 mL | ORAL | Status: DC | PRN

## 2024-06-10 MED ORDER — OXYCODONE-ACETAMINOPHEN 5-325 MG PO TABS: 2.0000 | ORAL_TABLET | ORAL | Status: DC | PRN

## 2024-06-10 MED ORDER — COCONUT OIL OIL
1.0000 | TOPICAL_OIL | Status: DC | PRN
  Administered 2024-06-12 (×2): 1 via TOPICAL

## 2024-06-10 MED ORDER — LEVONORGESTREL 20 MCG/DAY IU IUD
1.0000 | INTRAUTERINE_SYSTEM | Freq: Once | INTRAUTERINE | Status: AC
  Administered 2024-06-10: 1 via INTRAUTERINE
  Filled 2024-06-10: qty 1

## 2024-06-10 MED ORDER — SIMETHICONE 80 MG PO CHEW: 80.0000 mg | CHEWABLE_TABLET | ORAL | Status: DC | PRN

## 2024-06-10 MED ORDER — OXYCODONE-ACETAMINOPHEN 5-325 MG PO TABS: 1.0000 | ORAL_TABLET | ORAL | Status: DC | PRN

## 2024-06-10 MED ORDER — PHENYLEPHRINE 80 MCG/ML (10ML) SYRINGE FOR IV PUSH (FOR BLOOD PRESSURE SUPPORT): 80.0000 ug | PREFILLED_SYRINGE | INTRAVENOUS | Status: DC | PRN

## 2024-06-10 MED ORDER — SODIUM CHLORIDE 0.9 % IV SOLN
5.0000 10*6.[IU] | Freq: Once | INTRAVENOUS | Status: AC
  Administered 2024-06-10: 5 10*6.[IU] via INTRAVENOUS
  Filled 2024-06-10: qty 5

## 2024-06-10 MED ORDER — ONDANSETRON HCL 4 MG/2ML IJ SOLN: 4.0000 mg | INTRAMUSCULAR | Status: DC | PRN

## 2024-06-10 MED ORDER — PRENATAL MULTIVITAMIN CH
1.0000 | ORAL_TABLET | Freq: Every day | ORAL | Status: DC
  Administered 2024-06-11 – 2024-06-12 (×2): 1 via ORAL
  Filled 2024-06-10 (×2): qty 1

## 2024-06-10 MED ORDER — FENTANYL CITRATE (PF) 100 MCG/2ML IJ SOLN
100.0000 ug | INTRAMUSCULAR | Status: DC | PRN
  Administered 2024-06-10 (×2): 100 ug via INTRAVENOUS
  Filled 2024-06-10 (×2): qty 2

## 2024-06-10 MED ORDER — LACTATED RINGERS IV SOLN: 500.0000 mL | Freq: Once | INTRAVENOUS | Status: DC

## 2024-06-10 MED ORDER — LIDOCAINE HCL (PF) 1 % IJ SOLN: 30.0000 mL | INTRAMUSCULAR | Status: DC | PRN

## 2024-06-10 MED ORDER — DIBUCAINE (PERIANAL) 1 % EX OINT: 1.0000 | TOPICAL_OINTMENT | CUTANEOUS | Status: DC | PRN

## 2024-06-10 MED ORDER — ONDANSETRON HCL 4 MG/2ML IJ SOLN
4.0000 mg | Freq: Four times a day (QID) | INTRAMUSCULAR | Status: DC | PRN
Start: 2024-06-10 — End: 2024-06-10
  Administered 2024-06-10: 4 mg via INTRAVENOUS
  Filled 2024-06-10: qty 2

## 2024-06-10 MED ORDER — ACETAMINOPHEN 325 MG PO TABS: 650.0000 mg | ORAL_TABLET | ORAL | Status: DC | PRN

## 2024-06-10 MED ORDER — DIPHENHYDRAMINE HCL 50 MG/ML IJ SOLN: 12.5000 mg | INTRAMUSCULAR | Status: DC | PRN

## 2024-06-10 MED ORDER — OXYTOCIN-SODIUM CHLORIDE 30-0.9 UT/500ML-% IV SOLN
2.5000 [IU]/h | INTRAVENOUS | Status: DC
  Administered 2024-06-10: 2.5 [IU]/h via INTRAVENOUS
  Filled 2024-06-10: qty 500

## 2024-06-10 MED ORDER — ONDANSETRON HCL 4 MG PO TABS: 4.0000 mg | ORAL_TABLET | ORAL | Status: DC | PRN

## 2024-06-10 MED ORDER — LIDOCAINE HCL (PF) 1 % IJ SOLN
INTRAMUSCULAR | Status: DC | PRN
  Administered 2024-06-10 (×2): 4 mL via EPIDURAL

## 2024-06-10 MED ORDER — ZOLPIDEM TARTRATE 5 MG PO TABS: 5.0000 mg | ORAL_TABLET | Freq: Every evening | ORAL | Status: DC | PRN

## 2024-06-10 MED ORDER — FENTANYL-BUPIVACAINE-NACL 0.5-0.125-0.9 MG/250ML-% EP SOLN
12.0000 mL/h | EPIDURAL | Status: DC | PRN
  Administered 2024-06-10: 12 mL/h via EPIDURAL
  Filled 2024-06-10: qty 250

## 2024-06-10 MED ORDER — PARAGARD INTRAUTERINE COPPER IU IUD: 1.0000 | INTRAUTERINE_SYSTEM | Freq: Once | INTRAUTERINE | Status: DC

## 2024-06-10 MED ORDER — SENNOSIDES-DOCUSATE SODIUM 8.6-50 MG PO TABS
2.0000 | ORAL_TABLET | ORAL | Status: DC
  Administered 2024-06-11 – 2024-06-12 (×2): 2 via ORAL
  Filled 2024-06-10 (×2): qty 2

## 2024-06-10 MED ORDER — PHENYLEPHRINE 80 MCG/ML (10ML) SYRINGE FOR IV PUSH (FOR BLOOD PRESSURE SUPPORT)
80.0000 ug | PREFILLED_SYRINGE | INTRAVENOUS | Status: DC | PRN
Start: 2024-06-10 — End: 2024-06-10

## 2024-06-10 MED ORDER — BENZOCAINE-MENTHOL 20-0.5 % EX AERO: 1.0000 | INHALATION_SPRAY | CUTANEOUS | Status: DC | PRN

## 2024-06-10 MED ORDER — ACETAMINOPHEN 325 MG PO TABS
650.0000 mg | ORAL_TABLET | ORAL | Status: DC | PRN
  Administered 2024-06-11 – 2024-06-12 (×3): 650 mg via ORAL
  Filled 2024-06-10 (×3): qty 2

## 2024-06-10 MED ORDER — WITCH HAZEL-GLYCERIN EX PADS: 1.0000 | MEDICATED_PAD | CUTANEOUS | Status: DC | PRN

## 2024-06-10 MED ORDER — TETANUS-DIPHTH-ACELL PERTUSSIS 5-2-15.5 LF-MCG/0.5 IM SUSP: 0.5000 mL | Freq: Once | INTRAMUSCULAR | Status: DC

## 2024-06-10 MED ORDER — LACTATED RINGERS IV SOLN: INTRAVENOUS | Status: DC

## 2024-06-10 MED ORDER — PENICILLIN G POT IN DEXTROSE 60000 UNIT/ML IV SOLN
3.0000 10*6.[IU] | INTRAVENOUS | Status: DC
  Administered 2024-06-10: 3 10*6.[IU] via INTRAVENOUS
  Filled 2024-06-10: qty 50

## 2024-06-10 MED ORDER — DIPHENHYDRAMINE HCL 25 MG PO CAPS: 25.0000 mg | ORAL_CAPSULE | Freq: Four times a day (QID) | ORAL | Status: DC | PRN

## 2024-06-10 MED ORDER — LACTATED RINGERS IV SOLN: 500.0000 mL | INTRAVENOUS | Status: DC | PRN

## 2024-06-10 NOTE — Anesthesia Preprocedure Evaluation (Signed)
 Anesthesia Evaluation  Patient identified by MRN, date of birth, ID band Patient awake    Reviewed: Allergy & Precautions, Patient's Chart, lab work & pertinent test results  History of Anesthesia Complications Negative for: history of anesthetic complications  Airway Mallampati: II  TM Distance: >3 FB Neck ROM: Full    Dental no notable dental hx.    Pulmonary asthma    Pulmonary exam normal        Cardiovascular negative cardio ROS Normal cardiovascular exam     Neuro/Psych  Headaches    GI/Hepatic negative GI ROS, Neg liver ROS,,,  Endo/Other  negative endocrine ROS    Renal/GU negative Renal ROS     Musculoskeletal negative musculoskeletal ROS (+)    Abdominal   Peds  Hematology negative hematology ROS (+)   Anesthesia Other Findings   Reproductive/Obstetrics (+) Pregnancy                              Anesthesia Physical Anesthesia Plan  ASA: 2  Anesthesia Plan: Epidural   Post-op Pain Management:    Induction:   PONV Risk Score and Plan: Treatment may vary due to age or medical condition  Airway Management Planned: Natural Airway  Additional Equipment: Fetal Monitoring  Intra-op Plan:   Post-operative Plan:   Informed Consent: I have reviewed the patients History and Physical, chart, labs and discussed the procedure including the risks, benefits and alternatives for the proposed anesthesia with the patient or authorized representative who has indicated his/her understanding and acceptance.       Plan Discussed with:   Anesthesia Plan Comments:          Anesthesia Quick Evaluation

## 2024-06-10 NOTE — Progress Notes (Signed)
 Labor Progress Note Lindsay [name] is a 23 y.o. G2P1001 at [redacted]w[redacted]d by US  presenting for eIOL.  S:  Feeling increased pressure.  O:  BP 121/74   Pulse (!) 109   Temp 99.3 F (37.4 C) (Oral)   Resp 16   Ht 5' 5 (1.651 m)   Wt 97.1 kg   LMP 09/12/2023 (Approximate)   SpO2 100%   BMI 35.63 kg/m   EFM: baseline 130 bpm/ moderate variability/ 15x15 accels/ no decels  Toco/IUPC: 2-3 mins apart SVE: Dilation: 1 Effacement (%): 70 Station: -3 Presentation: Vertex (confirmed by bedside US ) Exam by:: Conard Me SNM.   A/P: 23 y.o. G2P1001 [redacted]w[redacted]d  1. Labor: Regular contractions with increasing discomfort. Possibly in early labor. Cervical exam performed due to rectal pressure and SNM found patient to be 1/70/-3. Discussed pain medication options and encouraged patient use restroom. Plan to recheck cervix in 2 hours to assess for cervical change. 2. FWB: Category 1 3. Pain: IV fentanyl  4. GBS +: PCN   Anticipate SVB.  Conard VEAR Me, Student-MidWife 4:54 PM

## 2024-06-10 NOTE — Anesthesia Procedure Notes (Signed)
 Epidural Patient location during procedure: OB Start time: 06/10/2024 6:25 PM End time: 06/10/2024 6:28 PM  Staffing Anesthesiologist: Paul Lamarr BRAVO, MD Performed: anesthesiologist   Preanesthetic Checklist Completed: patient identified, IV checked, risks and benefits discussed, monitors and equipment checked, pre-op evaluation and timeout performed  Epidural Patient position: sitting Prep: DuraPrep and site prepped and draped Patient monitoring: continuous pulse ox, blood pressure and heart rate Approach: midline Location: L3-L4 Injection technique: LOR air  Needle:  Needle type: Tuohy  Needle gauge: 17 G Needle length: 9 cm Needle insertion depth: 6 cm Catheter type: closed end flexible Catheter size: 19 Gauge Catheter at skin depth: 11 cm Test dose: negative and Other (1% lidocaine )  Assessment Events: blood not aspirated, no cerebrospinal fluid, injection not painful, no injection resistance, no paresthesia and negative IV test  Additional Notes Patient identified. Risks, benefits, and alternatives discussed with patient including but not limited to bleeding, infection, nerve damage, paralysis, failed block, incomplete pain control, headache, blood pressure changes, nausea, vomiting, reactions to medication, itching, and postpartum back pain. Confirmed with bedside nurse the patient's most recent platelet count. Confirmed with patient that they are not currently taking any anticoagulation, have any bleeding history, or any family history of bleeding disorders. Patient expressed understanding and wished to proceed. All questions were answered. Sterile technique was used throughout the entire procedure. Please see nursing notes for vital signs.   Crisp LOR on first pass. Test dose was given through epidural catheter and negative prior to continuing to dose epidural or start infusion. Warning signs of high block given to the patient including shortness of breath,  tingling/numbness in hands, complete motor block, or any concerning symptoms with instructions to call for help. Patient was given instructions on fall risk and not to get out of bed. All questions and concerns addressed with instructions to call with any issues or inadequate analgesia.  Reason for block:procedure for pain

## 2024-06-10 NOTE — Progress Notes (Signed)
 Patient ID: Jenniffer Vessels, female   DOB: February 02, 2001, 23 y.o.   MRN: [id number]   Subjective: -Care assumed of 23 y.o. G2P1001 at [redacted]w[redacted]d who presents for eIOL. In room to meet acquaintance of patient and family ([employer]).  Patient reports comfort with epidural.   Objective: BP 105/66   Pulse (!) 106   Temp 99.3 F (37.4 C) (Oral)   Resp 16   Ht 5' 5 (1.651 m)   Wt 97.1 kg   LMP 09/12/2023 (Approximate)   SpO2 98%   BMI 35.63 kg/m  No intake/output data recorded. No intake/output data recorded.  Fetal Monitoring: FHT: 110 bpm, Mod Var, -Decels, +15x15 Accels UC: Q2-56min    Physical Exam: General appearance: alert, well appearing, and in no distress. Chest: normal rate and regular rhythm.  clear to auscultation, no wheezes, rales or rhonchi, symmetric air entry. Abdominal exam: Gravid, Appears AGA. Extremities: No Edema  Skin exam: Warm Dry  Vaginal Exam: SVE:   Dilation: 10 Effacement (%): 100 Station: 0 Exam by:: Valora Norell CNM Membranes:AROM of moderate amt clear fluid. Internal Monitors: None  Augmentation/Induction: Pitocin :None Cytotec : None  Assessment:  IUP at 39.4 weeks Cat I FT  2nd Stage Labor Desires PP IUD  Plan: -Discussed AROM r/b including increased risk of infection, cord prolapse, fetal intolerance, and decreased labor time. No questions or concerns and patient desires to proceed with AROM.  -Reviewed r/b of postplacental IUD including insertion procedure, expulsion rates, and anticipated bleeding. -Reviewed types of IUD; hormonal vs non hormonal and patient desires to proceed with hormonal. -Mirena ordered. -Reviewed desires for infant circumcision. Consent completed and documented in infant chart.  -Anticipate SVD.  Harlene CROME Regan Llorente,MSN, CNM 06/10/2024, 9:06 PM

## 2024-06-10 NOTE — Discharge Summary (Signed)
 Postpartum Discharge Summary  Patient Name: Lindsay [name] DOB: Jan 19, 2001 MRN: [id number]  Date of admission: 06/10/2024 Delivery date:06/10/2024 Delivering provider: SYNTHIA RAISIN Date of discharge: 06/12/2024  Admitting diagnosis: Indication for care in labor or delivery [O75.9] Intrauterine pregnancy: [redacted]w[redacted]d     Secondary diagnosis:  Principal Problem:   Vaginal delivery Active Problems:   Encounter for insertion of 52 mg levonorgestrel-releasing intrauterine device (IUD)  Additional problems: GBS+    Discharge diagnosis: Term Pregnancy Delivered                                              Post partum procedures:none Augmentation: AROM Complications: None  Hospital course: Induction of Labor With Vaginal Delivery   23 y.o. yo [id number] at [redacted]w[redacted]d was admitted to the hospital 06/10/2024 for induction of labor.  Indication for induction: Elective.  Patient had an labor course that was uncomplicated. Membrane Rupture Time/Date: 9:12 PM,06/10/2024  Delivery Method:Vaginal, Spontaneous Operative Delivery:N/A Episiotomy: None Lacerations:  None Details of delivery can be found in separate delivery note.  Patient had a postpartum course complicated by none. Patient is discharged home 06/12/24.  Newborn Data: Birth date:06/10/2024 Birth time:9:51 PM Gender:Female-Nassiah Living status:Living Apgars:7 ,9  Weight:3572 g  Magnesium  Sulfate received: No BMZ received: No Rhophylac:N/A MMR:N/A T-DaP:Given prenatally Flu: No RSV Vaccine received: No Transfusion:No  Immunizations received: Immunization History  Administered Date(s) Administered   DTaP 05/06/2002, 08/17/2002, 10/06/2002, 05/23/2003, 03/09/2006   H1N1 07/05/2008   HIB (PRP-OMP) 05/06/2002, 08/17/2002, 10/06/2002, 05/23/2003   HPV 9-valent 06/12/2014   HPV Quadrivalent 11/21/2013, 02/01/2014   Hepatitis A 03/09/2006, 11/20/2006   Hepatitis B 09-02-01, 05/06/2002, 10/06/2002   IPV 05/06/2002, 08/17/2002,  10/06/2002, 03/09/2006   Influenza Nasal 07/02/2011, 04/12/2014   Influenza,inj,Quad PF,6+ Mos 09/13/2015, 04/25/2016, 04/29/2017   Influenza-Unspecified 05/29/2006, 07/09/2007, 07/05/2008, 05/22/2010, 09/23/2012, 05/03/2013   MMR 05/23/2003, 03/09/2006   Meningococcal Conjugate 11/21/2013   Pneumococcal-Unspecified 08/17/2002, 10/06/2002, 02/23/2004   Tdap 11/21/2013, 10/10/2020, 05/04/2024   Varicella 02/23/2004, 03/09/2006    Physical exam  Vitals:   06/11/24 0810 06/11/24 1218 06/11/24 2000 06/12/24 0605  BP: 110/81 109/76 113/78 119/80  Pulse: 66 82 79 86  Resp: 16 17 16 16   Temp: 98.3 F (36.8 C) 98.3 F (36.8 C) 98.3 F (36.8 C) 98.4 F (36.9 C)  TempSrc: Oral Oral Oral Oral  SpO2: 100% 100% 100% 100%  Weight:      Height:       General: alert, cooperative, and no distress Lochia: appropriate Uterine Fundus: firm Incision: N/A DVT Evaluation: No significant calf/ankle edema. Labs: Lab Results  Component Value Date   WBC 12.0 (H) 06/11/2024   HGB 11.9 (L) 06/11/2024   HCT 35.3 (L) 06/11/2024   MCV 89.6 06/11/2024   PLT 218 06/11/2024      Latest Ref Rng & Units 11/07/2023    4:12 PM  CMP  Glucose 70 - 99 mg/dL 83   BUN 6 - 20 mg/dL 6   Creatinine 9.55 - 8.99 mg/dL 9.33   Sodium 864 - 854 mmol/L 133   Potassium 3.5 - 5.1 mmol/L 3.9   Chloride 98 - 111 mmol/L 103   CO2 22 - 32 mmol/L 23   Calcium 8.9 - 10.3 mg/dL 9.4   Total Protein 6.5 - 8.1 g/dL 7.2   Total Bilirubin 0.0 - 1.2 mg/dL 0.5   Alkaline Phos 38 -  126 U/L 52   AST 15 - 41 U/L 10   ALT 0 - 44 U/L 6    Edinburgh Score:    06/11/2024    8:31 AM  Edinburgh Postnatal Depression Scale Screening Tool  I have been able to laugh and see the funny side of things. 0  I have looked forward with enjoyment to things. 0  I have blamed myself unnecessarily when things went wrong. 0  I have been anxious or worried for no good reason. 0  I have felt scared or panicky for no good reason. 0  Things have  been getting on top of me. 0  I have been so unhappy that I have had difficulty sleeping. 0  I have felt sad or miserable. 0  I have been so unhappy that I have been crying. 0  The thought of harming myself has occurred to me. 0  Edinburgh Postnatal Depression Scale Total 0   Edinburgh Postnatal Depression Scale Total: 0   After visit meds:  Allergies as of 06/12/2024       Reactions   Valproic Acid     Altered mental status        Medication List     STOP taking these medications    amoxicillin  500 MG capsule Commonly known as: AMOXIL    benzocaine  10 % mucosal gel Commonly known as: ORAJEL   cyclobenzaprine  10 MG tablet Commonly known as: FLEXERIL    Dulcolax 5 MG EC tablet Generic drug: bisacodyl   Ferric Maltol  30 MG Caps   miconazole  2 % vaginal cream Commonly known as: MONISTAT  7       TAKE these medications    acetaminophen  500 MG tablet Commonly known as: TYLENOL  Take 2 tablets (1,000 mg total) by mouth every 6 (six) hours as needed for mild pain (pain score 1-3) or moderate pain (pain score 4-6). What changed: reasons to take this   ibuprofen  600 MG tablet Commonly known as: ADVIL  Take 1 tablet (600 mg total) by mouth every 6 (six) hours.   multivitamin-prenatal 27-0.8 MG Tabs tablet Take 1 tablet by mouth daily at 12 noon.         Discharge home in stable condition Infant Feeding: Bottle and Breast Infant Disposition:home with mother Discharge instruction: per After Visit Summary and Postpartum booklet. Activity: Advance as tolerated. Pelvic rest for 6 weeks.  Diet: routine diet Future Appointments: No future appointments.  Follow up Visit: Message sent 06/11/2024 at 0752   Please schedule this patient for a In person postpartum visit in 6 weeks with the following provider: Any provider. Additional Postpartum F/U:String Check at PPV  Low risk pregnancy complicated by: N/A Delivery mode:  Vaginal, Spontaneous Anticipated Birth  Control:  PP IUD placed   06/12/2024 Leeroy KATHEE Pouch, MD

## 2024-06-10 NOTE — H&P (Addendum)
 OBSTETRIC ADMISSION HISTORY AND PHYSICAL  Lindsay [name] is a 23 y.o. female G2P1001 with IUP at [redacted]w[redacted]d by US  presenting for eIOL. She reports +FMs, No LOF, no VB, no blurry vision, headaches or peripheral edema, and RUQ pain.  She plans on breast/bottle feeding. She request paraguard IUD for birth control. She received her prenatal care at [facility].   Dating: By US  --->  Estimated Date of Delivery: 06/13/24  Sono:  @[redacted]w[redacted]d , CWD, normal anatomy, cephalic presentation, fundal left lateral, placenta 296g, 67% EFW  Prenatal History/Complications: GBS +  NURSING  PROVIDER  Office Location [facility] Dating by LMP c/w U/S at 8 wks  [facility] Model Traditional Anatomy U/S normal  Initiated care at  [employer]  English              LAB RESULTS   Support Person  Genetics NIPS: LR female AFP:     Carrier Screen Horizon: Neg x 4  Rhogam  O/Positive/-- (05/07 1217) A1C/GTT Early HgbA1C: 5.4 Third trimester 2 hr GTT: Nml  Flu Vaccine     TDaP Vaccine  05/04/2024 Blood Type O/Positive/-- (05/07 1217)  RSV Vaccine  Antibody Negative (05/07 1217)  COVID Vaccine  Rubella 8.24 (05/07 1217)  Feeding Plan Both RPR Non Reactive (05/07 1217)  Contraception Paragard IUD, possibly IPP HBsAg Negative (05/07 1217)  Circumcision Yes HIV Non Reactive (05/07 1217)  Pediatrician  Tim and Elveria Ester HCVAb Non Reactive (05/07 1217)  Prenatal Classes No    BTL Consent  Pap Diagnosis  Date Value Ref Range Status  12/02/2023   Final   - Negative for intraepithelial lesion or malignancy (NILM)    BTL Pre-payment  GC/CT Initial:   36wks:  neg  VBAC Consent  GBS  POSITIVE  BRx Optimized? [ ]  yes   [ ]  no    DME Rx [ x] BP cuff [ ]  Weight Scale Waterbirth  [ ]  Class [ ]  Consent [ ]  CNM visit  PHQ9 & GAD7 [x]  new OB [ x ] 28 weeks  [ x ] 36 weeks Induction  [ ]  Orders Entered [ ] Foley Y/N    Past Medical History: Past Medical History:  Diagnosis Date   Attention deficit disorder with  hyperactivity(314.01) 07/05/2013   Attention deficit hyperactivity disorder (ADHD) 07/05/2013   Chronic migraine without aura with status migrainosus, not intractable 09/28/2020   Episodic tension type headache 11/01/2014   Intermittent asthma 11/21/2013   Triggered by seasonal allergies & occasional exercise induced.    Keloid of skin 04/19/2019   Medical history non-contributory    Migraine without aura 05/09/2013   Seasonal allergies    Past Surgical History: Past Surgical History:  Procedure Laterality Date   NO PAST SURGERIES     Obstetrical History: OB History     Gravida  2   Para  1   Term  1   Preterm      AB      Living  1      SAB      IAB      Ectopic      Multiple  0   Live Births  1          Social History Social History   Socioeconomic History   Marital status: Significant Other    Spouse name: Not on file   Number of children: 1   Years of education:  Not on file   Highest education level: Not on file  Occupational History   Not on file  Tobacco Use   Smoking status: Never   Smokeless tobacco: Never  Vaping Use   Vaping status: Never Used  Substance and Sexual Activity   Alcohol use: No    Alcohol/week: 0.0 standard drinks of alcohol   Drug use: No   Sexual activity: Yes    Birth control/protection: None  Other Topics Concern   Not on file  Social History Narrative   [name] graduated early from [employer].    She is waiting to be cleared to go back to work.    She lives with her mother.   She enjoys cooking, drawing, and painting.   Occas soda   Social Drivers of Corporate Investment Banker Strain: Not on file  Food Insecurity: Not on file  Transportation Needs: Not on file  Physical Activity: Not on file  Stress: Not on file  Social Connections: Not on file   Family History: Family History  Problem Relation Age of Onset   GER disease Mother    Constipation Mother    Irritable bowel syndrome Mother    Anxiety  disorder Mother    Depression Mother    Migraines Mother    Allergies: Allergies  Allergen Reactions   Valproic Acid      Altered mental status   Medications Prior to Admission  Medication Sig Dispense Refill Last Dose/Taking   acetaminophen  (TYLENOL ) 500 MG tablet Take 1,000 mg by mouth every 6 (six) hours as needed for mild pain or headache. (Patient not taking: Reported on 05/04/2024)      amoxicillin  (AMOXIL ) 500 MG capsule Take 500 mg by mouth 3 (three) times daily. (Patient not taking: Reported on 05/04/2024)      benzocaine  (ORAJEL) 10 % mucosal gel Use as directed 1 Application in the mouth or throat as needed for mouth pain. 7 g 0    bisacodyl (DULCOLAX) 5 MG EC tablet Take 1 tablet (5 mg total) by mouth once as needed for up to 1 dose for moderate constipation. (Patient not taking: Reported on 05/04/2024) 7 tablet 0    cyclobenzaprine  (FLEXERIL ) 10 MG tablet Take 1 tablet (10 mg total) by mouth 3 (three) times daily as needed for muscle spasms. (Patient not taking: Reported on 05/04/2024) 30 tablet 2    cyclobenzaprine  (FLEXERIL ) 10 MG tablet Take 1 tablet (10 mg total) by mouth every 8 (eight) hours as needed for muscle spasms. 30 tablet 1    Ferric Maltol  30 MG CAPS Take 1 capsule (30 mg total) by mouth 2 (two) times daily. Please take one hour before breakfast and dinner (Patient not taking: Reported on 05/04/2024) 60 capsule 2    miconazole  (MONISTAT  7) 2 % vaginal cream Place 1 Applicatorful vaginally at bedtime. Apply for seven nights (Patient not taking: Reported on 05/04/2024) 30 g 2    Prenatal Vit-Fe Fumarate-FA (MULTIVITAMIN-PRENATAL) 27-0.8 MG TABS tablet Take 1 tablet by mouth daily at 12 noon. 30 tablet 3      Review of Systems   All systems reviewed and negative except as stated in HPI  Blood pressure 121/74, pulse (!) 109, resp. rate 16, height 5' 5 (1.651 m), weight 97.1 kg, last menstrual period 09/12/2023, SpO2 100%, unknown if currently breastfeeding. General  appearance: alert and cooperative Lungs: clear to auscultation bilaterally Heart: regular rate and rhythm Abdomen: soft, non-tender; bowel sounds normal Extremities: Homans sign is negative, no sign of  DVT Presentation: cephalic Fetal monitoring - Baseline: 130 bpm, Variability: Good {> 6 bpm), Accelerations: Reactive, and Decelerations: Absent Uterine activity - Date/time of onset: Frequency: Every 2-5 minutes, Duration: 50-80 seconds, and Intensity: mild  Dilation: 1 Effacement (%): 70 Station: -3 Presentation: Vertex (confirmed by bedside US ) Exam by:: Conard Me SNM.   Prenatal labs: ABO, Rh: O/Positive/-- (05/07 1217) Antibody: Negative (05/07 1217) Rubella: 8.24 (05/07 1217) RPR: Non Reactive (08/27 0854)  HBsAg: Negative (05/07 1217)  HIV: Non Reactive (08/27 0854)  GBS: Positive/-- (10/22 0334)     Lab Results  Component Value Date   GBS Positive (A) 05/18/2024   GTT: Fasting- 81, 1 Hour-164, 2 Hour- 119 Genetic screening  normal Anatomy US  Female @[redacted]w[redacted]d , CWD, normal anatomy, cephalic presentation, fundal left lateral, placenta 296g, 67% EFW  Immunization History  Administered Date(s) Administered   DTaP 05/06/2002, 08/17/2002, 10/06/2002, 05/23/2003, 03/09/2006   H1N1 07/05/2008   HIB (PRP-OMP) 05/06/2002, 08/17/2002, 10/06/2002, 05/23/2003   HPV 9-valent 06/12/2014   HPV Quadrivalent 11/21/2013, 02/01/2014   Hepatitis A 03/09/2006, 11/20/2006   Hepatitis B 12/09/2001, 05/06/2002, 10/06/2002   IPV 05/06/2002, 08/17/2002, 10/06/2002, 03/09/2006   Influenza Nasal 07/02/2011, 04/12/2014   Influenza,inj,Quad PF,6+ Mos 09/13/2015, 04/25/2016, 04/29/2017   Influenza-Unspecified 05/29/2006, 07/09/2007, 07/05/2008, 05/22/2010, 09/23/2012, 05/03/2013   MMR 05/23/2003, 03/09/2006   Meningococcal Conjugate 11/21/2013   Pneumococcal-Unspecified 08/17/2002, 10/06/2002, 02/23/2004   Tdap 11/21/2013, 10/10/2020, 05/04/2024   Varicella 02/23/2004, 03/09/2006   Prenatal  Transfer Tool  Maternal Diabetes: No Genetic Screening: Normal Maternal Ultrasounds/Referrals: Normal Fetal Ultrasounds or other Referrals:  None Maternal Substance Abuse:  No Significant Maternal Medications:  None Significant Maternal Lab Results: Group B Strep positive Number of Prenatal Visits:greater than 3 verified prenatal visits Maternal Vaccinations:TDap Other Comments:  None  No results found for this or any previous visit (from the past 24 hours).  Patient Active Problem List   Diagnosis Date Noted   Indication for care in labor or delivery 06/10/2024   Group B Streptococcus carrier, +RV culture, currently pregnant 05/20/2024   Low serum ferritin level 03/24/2024   BMI 32.0-32.9,adult 12/30/2023   Obesity in pregnancy 12/30/2023   UTI in pregnancy, antepartum, first trimester 11/23/2023   Supervision of other normal pregnancy, antepartum 11/17/2023   Tooth pain 11/17/2023   Assessment/Plan:  Jontae Sonier is a 23 y.o. G2P1001 at [redacted]w[redacted]d here for eIOL.  #Labor: Came into L&D contracting every 2-3 minutes and rating them as uncomfortable. Will expectantly manage for now. #Pain: Planning epidural #FWB: Cat 1 #GBS status:  positive #Feeding: Breastmilk  and Formula #Reproductive Life planning: post-placental IUD Paragard #Circ:  yes  Conard VEAR Me, Student-MidWife  06/10/2024, 3:10 PM  Attestation of Supervision of Student:  I confirm that I have verified the information documented in the nurse midwife student's note and that I have also personally supervised the history, physical exam and all medical decision making activities.  I have verified that all services and findings are accurately documented in this student's note; and I agree with management and plan as outlined in the documentation. I have also made any necessary editorial changes.  Cornell JONELLE Finder, CNM [facility] for [employer], [facility] 06/10/2024 5:21 PM

## 2024-06-11 DIAGNOSIS — Z30430 Encounter for insertion of intrauterine contraceptive device: Secondary | ICD-10-CM

## 2024-06-11 LAB — CBC
HCT: 35.3 % — ABNORMAL LOW (ref 36.0–46.0)
Hemoglobin: 11.9 g/dL — ABNORMAL LOW (ref 12.0–15.0)
MCH: 30.2 pg (ref 26.0–34.0)
MCHC: 33.7 g/dL (ref 30.0–36.0)
MCV: 89.6 fL (ref 80.0–100.0)
Platelets: 218 K/uL (ref 150–400)
RBC: 3.94 MIL/uL (ref 3.87–5.11)
RDW: 14.4 % (ref 11.5–15.5)
WBC: 12 K/uL — ABNORMAL HIGH (ref 4.0–10.5)
nRBC: 0 % (ref 0.0–0.2)

## 2024-06-11 LAB — RUBELLA SCREEN: Rubella: 5.36 {index} (ref >0.99)

## 2024-06-11 LAB — RPR: RPR Ser Ql: NONREACTIVE

## 2024-06-11 NOTE — Lactation Note (Signed)
 This note was copied from a baby's chart. Lactation Consultation Note  Patient Name: Boy Darnell Stimson Unijb'd Date: 06/11/2024 Age:22 hours   Attempted to see mom but everyone was sleeping.  Maternal Data    Feeding Nipple Type: Slow - flow  LATCH Score Latch: Grasps breast easily, tongue down, lips flanged, rhythmical sucking.  Audible Swallowing: A few with stimulation  Type of Nipple: Everted at rest and after stimulation  Comfort (Breast/Nipple): Soft / non-tender  Hold (Positioning): Assistance needed to correctly position infant at breast and maintain latch.  LATCH Score: 8   Lactation Tools Discussed/Used    Interventions    Discharge    Consult Status      Hennessy Bartel G 06/11/2024, 4:04 AM

## 2024-06-11 NOTE — Progress Notes (Signed)
 POSTPARTUM PROGRESS NOTE  Post Partum Day 1  Subjective:  Lindsay [name] is a 23 y.o. H7E7997 s/p NSVD at [redacted]w[redacted]d.  She reports she is doing well. No acute events overnight. She denies any problems with ambulating, voiding or po intake. Denies nausea or vomiting.  Pain is well controlled.  Lochia is minimal.  Objective: Blood pressure 110/81, pulse 66, temperature 98.3 F (36.8 C), temperature source Oral, resp. rate 16, height 5' 5 (1.651 m), weight 97.1 kg, last menstrual period 09/12/2023, SpO2 100%, unknown if currently breastfeeding.  Physical Exam:  General: alert, cooperative and no distress Chest: no respiratory distress Heart:regular rate, distal pulses intact Abdomen: soft, nontender,  Uterine Fundus: firm, appropriately tender DVT Evaluation: No calf swelling or tenderness Extremities: no LE edema Skin: warm, dry  Recent Labs    06/10/24 1503 06/11/24 0516  HGB 13.2 11.9*  HCT 38.5 35.3*    Assessment/Plan: Lindsay [name] is a 23 y.o. H7E7997 s/p NSVD at [redacted]w[redacted]d   PPD#1 - Doing well  Routine postpartum care  Labile BP's: some elevateds but suspect they are spurious. Will add on CMP and continue to monitor BP's, low threshold to start medication  Contraception: Post placental Mirena placed at time of delivery Feeding: breast and bottle  Dispo: Plan for discharge PPD#2.   LOS: 1 day    Donnice CHRISTELLA Carolus, MD/MPH Attending Family Medicine Physician, [facility], [facility]  06/11/2024, 10:06 AM

## 2024-06-11 NOTE — Lactation Note (Addendum)
 This note was copied from a baby's chart. Lactation Consultation Note  Patient Name: Lindsay [name] Date: 06/11/2024 Age:22 hours Reason for consult: Initial assessment;Term  P2, Baby has been spitty.  Mother is breastfeeding and formula feeding. Encouraged offering the breast before formula. Reviewed hand expression with drops expressed. Volume guidelines given.  Assisted with latching but baby continues to be spitty and did not open wide enough to latch at this time. LC emailed [facility] pump referral.  Feed on demand with cues.  Goal 8-12+ times per day after first 24 hrs. Suggest calling for help as needed.    Maternal Data Has patient been taught Hand Expression?: Yes Does the patient have breastfeeding experience prior to this delivery?: Yes How long did the patient breastfeed?: 3 weeks  Feeding Mother's Current Feeding Choice: Breast Milk and Formula Nipple Type: Extra Slow Flow  Interventions Interventions: Breast feeding basics reviewed;Assisted with latch;Hand express;Support pillows;Education;CDC milk storage guidelines;LC Services brochure  Discharge Pump: Referral sent for [facility]  Consult Status Consult Status: Follow-up Date: 06/12/24 Follow-up type: In-patient    Shannon Dines [facility] 06/11/2024, 8:52 AM

## 2024-06-11 NOTE — Progress Notes (Signed)
 CSW received and acknowledges consult for ADHD.  Consult screened out due to ADHD does not warrant a CSW consult. Of note, chart review notes a history of anxiety with Zoloft  as treatment in 2022; however, no mental health concerns are noted since 2022. Edinburgh = 0.   Please contact the Clinical Social Worker if needs arise or by UNUMPROVIDENT request.   Signed,  Usha Slager K. Douglas Smolinsky, MSW, LCSWA, LCASA 06/11/2024 4:01 PM

## 2024-06-11 NOTE — Procedures (Signed)
 [name]  [birth year] [id number]  Post-Placental IUD Insertion Procedure Note  Patient identified, informed consent signed prior to delivery, signed copy in chart, time out was performed.    Vaginal, labial and perineal areas thoroughly inspected for lacerations and negative.    IUD inserted with inserter per manufacturer's instructions.    Strings trimmed to the level of the introitus. Patient tolerated procedure well.  Patient given post procedure instructions and IUD care card with expiration date.  Patient is asked to keep IUD strings tucked in her vagina until her postpartum follow up visit in 4-6 weeks. Patient advised to abstain from sexual intercourse and pulling on strings before her follow-up visit. Patient verbalized an understanding of the plan of care and agrees.    Harlene LITTIE Duncans MSN, CNM Advanced Practice Provider, Center for [employer]

## 2024-06-11 NOTE — Anesthesia Postprocedure Evaluation (Signed)
 Anesthesia Post Note  Patient: Lindsay [name]  Procedure(s) Performed: AN AD HOC LABOR EPIDURAL     Patient location during evaluation: Mother Baby Anesthesia Type: Epidural Level of consciousness: awake and alert Pain management: pain level controlled Vital Signs Assessment: post-procedure vital signs reviewed and stable Respiratory status: spontaneous breathing, nonlabored ventilation and respiratory function stable Cardiovascular status: stable Postop Assessment: no headache, no backache and epidural receding Anesthetic complications: no   No notable events documented.  Last Vitals:  Vitals:   06/11/24 0441 06/11/24 0810  BP: 105/63 110/81  Pulse: 75 66  Resp: 17 16  Temp: 36.7 C 36.8 C  SpO2: 100% 100%    Last Pain:  Vitals:   06/11/24 0810  TempSrc: Oral  PainSc:    Pain Goal:                   [name]

## 2024-06-12 ENCOUNTER — Other Ambulatory Visit (HOSPITAL_COMMUNITY): Payer: Self-pay

## 2024-06-12 MED ORDER — IBUPROFEN 600 MG PO TABS
600.0000 mg | ORAL_TABLET | Freq: Four times a day (QID) | ORAL | 0 refills | Status: AC
  Filled 2024-06-12: qty 30, 8d supply, fill #0

## 2024-06-12 MED ORDER — ACETAMINOPHEN 500 MG PO TABS
1000.0000 mg | ORAL_TABLET | Freq: Four times a day (QID) | ORAL | 0 refills | Status: AC | PRN
  Filled 2024-06-12: qty 60, 8d supply, fill #0

## 2024-06-12 NOTE — Lactation Note (Signed)
 This note was copied from a baby's chart. Lactation Consultation Note  Patient Name: Boy Thersa Mohiuddin Unijb'd Date: 06/12/2024 Age:22 hours, P2  Reason for consult: Follow-up assessment;Term;Infant weight loss Baby has received mostly bottles. Per mom tried to pump with the DEBP and didn't get even drops. LC reminded mom pumping can be a slow process.  Per mom has a DEBP for home and LC recommended to work on  pumping to enhance her milk coming in and latching.  Since the baby has had bottles, may have to give him an appetizer from the bottle and then latch .  LC reviewed breast feeding D/C teaching and the [facility] resources.  Maternal Data Has patient been taught Hand Expression?: Yes Does the patient have breastfeeding experience prior to this delivery?: Yes  Feeding Mother's Current Feeding Choice: Breast Milk and Formula Nipple Type: Extra Slow Flow  LATCH Score   Lactation Tools Discussed/Used Tools: Pump;Flanges Flange Size: 21 (per  mom comfortable) Breast pump type: Double-Electric Breast Pump Pump Education: Setup, frequency, and cleaning;Milk Storage Pumped volume: 0 mL  Interventions Interventions: Breast feeding basics reviewed;Education;LC Services brochure;CDC milk storage guidelines;CDC Guidelines for Breast Pump Cleaning  Discharge Discharge Education: Engorgement and breast care;Warning signs for feeding baby;Outpatient recommendation;Other (comment) (mom aware and will work on her milk supply with pumping and call when her milk comes in) Pump: Personal;Received Stork Pump;DEBP;Manual  Consult Status Consult Status: Complete Date: 06/12/24    Rollene Jenkins Fiedler 06/12/2024, 11:34 AM

## 2024-06-20 ENCOUNTER — Inpatient Hospital Stay (HOSPITAL_COMMUNITY)

## 2024-06-22 ENCOUNTER — Telehealth (HOSPITAL_COMMUNITY): Payer: Self-pay | Admitting: *Deleted

## 2024-06-22 NOTE — Telephone Encounter (Signed)
 06/22/2024  Name: Lindsay [name] MRN: [id number] DOB: Mar 29, 2001  Reason for Call:  Transition of Care Hospital Discharge Call  Contact Status: Patient Contact Status: Message  Language assistant needed:          Follow-Up Questions:    Van Postnatal Depression Scale:  In the Past 7 Days:    PHQ2-9 Depression Scale:     Discharge Follow-up:    Post-discharge interventions: NA  Mliss Sieve, RN 06/22/2024 14:26

## 2024-07-22 ENCOUNTER — Ambulatory Visit: Admitting: Obstetrics and Gynecology

## 2024-08-04 ENCOUNTER — Ambulatory Visit (INDEPENDENT_AMBULATORY_CARE_PROVIDER_SITE_OTHER): Admitting: Obstetrics and Gynecology

## 2024-08-04 DIAGNOSIS — Z30431 Encounter for routine checking of intrauterine contraceptive device: Secondary | ICD-10-CM | POA: Diagnosis not present

## 2024-08-04 NOTE — Progress Notes (Addendum)
" ° ° °  Post Partum Visit Note  Lindsay [name] is a 24 y.o. [id number] s/p 11/14 SVD/intact perineum at 39wks after elective IOL. She had an IUD placed immediately PP (type not specified).  Anesthesia: epidural. Postpartum course has been uncomplicated. Baby is doing well. Baby is feeding by bottle - Similac Sensitive RS. Bleeding staining only. Bowel function is normal. Bladder function is normal. Patient is sexually active. Contraception method is IUD. Postpartum depression screening: negative.  Pap neg 2025  Review of Systems Pertinent items are noted in HPI.  Objective:  BP 120/73   Pulse 75   Wt 207 lb (93.9 kg)   LMP 09/12/2023 (Approximate)   Breastfeeding No   BMI 34.45 kg/m    General: NAD Pelvic exam: VULVA: normal appearing vulva with no masses, tenderness or lesions, VAGINA: normal appearing vagina with normal color and discharge, no lesions, CERVIX: normal appearing cervix without discharge or lesions, grey strings approx 2cm out of osexam chaperoned by RN.  Assessment:   Normal postpartum exam.   Plan:  Routine care. Pap due in 3 years.   Patient interested in pelvic floor PT since she's had a vaginal delivery.   Diagnosis  Date Value Ref Range Status  12/02/2023   Final   - Negative for intraepithelial lesion or malignancy (NILM)   RTC PRN  Bebe Furry, MD Center for [facility], [facility]  "

## 2024-08-04 NOTE — Addendum Note (Signed)
 Addended by: Terry Bolotin on: 08/04/2024 02:27 PM   Modules accepted: Orders

## 2024-10-17 ENCOUNTER — Ambulatory Visit: Admitting: Physical Therapy
